# Patient Record
Sex: Female | Born: 1975 | Race: Black or African American | Hispanic: No | Marital: Single | Smoking: Never smoker
Health system: Southern US, Community
[De-identification: ages and names within clinical notes are randomized; demographics above are authoritative.]

## PROBLEM LIST (undated history)

## (undated) DIAGNOSIS — J302 Other seasonal allergic rhinitis: Secondary | ICD-10-CM

## (undated) DIAGNOSIS — R7303 Prediabetes: Secondary | ICD-10-CM

## (undated) DIAGNOSIS — K219 Gastro-esophageal reflux disease without esophagitis: Secondary | ICD-10-CM

## (undated) DIAGNOSIS — I10 Essential (primary) hypertension: Secondary | ICD-10-CM

## (undated) HISTORY — DX: Essential (primary) hypertension: I10

## (undated) HISTORY — PX: WISDOM TOOTH EXTRACTION: SHX21

## (undated) HISTORY — DX: Prediabetes: R73.03

## (undated) HISTORY — PX: BUNIONECTOMY: SHX129

## (undated) HISTORY — DX: Other seasonal allergic rhinitis: J30.2

## (undated) HISTORY — DX: Gastro-esophageal reflux disease without esophagitis: K21.9

---

## 2014-02-05 ENCOUNTER — Ambulatory Visit (INDEPENDENT_AMBULATORY_CARE_PROVIDER_SITE_OTHER): Payer: BC Managed Care – PPO | Admitting: Family Medicine

## 2014-02-05 ENCOUNTER — Encounter: Payer: Self-pay | Admitting: Family Medicine

## 2014-02-05 VITALS — BP 150/100 | HR 69 | Temp 98.5°F | Resp 16 | Ht 65.0 in | Wt 248.2 lb

## 2014-02-05 DIAGNOSIS — I10 Essential (primary) hypertension: Secondary | ICD-10-CM | POA: Insufficient documentation

## 2014-02-05 DIAGNOSIS — E669 Obesity, unspecified: Secondary | ICD-10-CM | POA: Insufficient documentation

## 2014-02-05 DIAGNOSIS — R5383 Other fatigue: Secondary | ICD-10-CM

## 2014-02-05 DIAGNOSIS — R5381 Other malaise: Secondary | ICD-10-CM

## 2014-02-05 DIAGNOSIS — Z1231 Encounter for screening mammogram for malignant neoplasm of breast: Secondary | ICD-10-CM

## 2014-02-05 LAB — POCT GLYCOSYLATED HEMOGLOBIN (HGB A1C): Hemoglobin A1C: 5.1

## 2014-02-05 MED ORDER — HYDROCHLOROTHIAZIDE 25 MG PO TABS: 25.0000 mg | ORAL_TABLET | Freq: Every day | ORAL | Status: DC

## 2014-02-05 MED ORDER — VERAPAMIL HCL ER 180 MG PO TBCR: 180.0000 mg | EXTENDED_RELEASE_TABLET | Freq: Every day | ORAL | Status: DC

## 2014-02-05 NOTE — Progress Notes (Signed)
Subjective:    Patient ID: Rachel Swanson, female    DOB: Mar 14, 1976, 38 y.o.   MRN: [id number]  HPI  This 38 y.o. AA female is new to [facility]; previously medical provided at [facility] due to lack of insurance. HTN diagnosed > 5 years ago and treated w/ Lisinopril- HCTZ; adverse reaction >> cough. Taking HCTZ 12.5- 25 mg daily; no labs ever checked and NP continue to prescribed HCTZ 50 mg though pt had been advised that higher dose not appropriate. Pt ran out of medication and experienced bloating and ankle edema. Brother-in-law had HCTZ 25 mg; pt resumed this med and felt better. Also took Furosemide supplied by a family member; this helped reduced edema.   She is experiencing fatigue though she is exercising and trying to lose weight. She requests medication for weight loss. She has lost ~ 11 lbs.  HCM: PAP ~ 2 years ago (last 3 PAPs normal). No GYN complaints. MMG- Never- 1st cousin diagnosed w/ breast cancer at age <51; pt has tried to get mammogram scheduled when she lived in [place] but incurance would not cover this test despite her family hx.   Review of Systems  Constitutional: Positive for activity change. Negative for diaphoresis, appetite change and unexpected weight change.  Eyes: Negative.        Eye exam within last 3 months.  Respiratory: Negative.   Cardiovascular: Positive for leg swelling. Negative for chest pain and palpitations.  Gastrointestinal: Negative.   Endocrine: Negative.   Musculoskeletal: Negative.   Skin: Negative.   Neurological: Positive for headaches.  Psychiatric/Behavioral: Negative.       Objective:   Physical Exam  Nursing note and vitals reviewed. Constitutional: She is oriented to person, place, and time. She appears well-developed and well-nourished. No distress.  HENT:  Head: Normocephalic and atraumatic.  Right Ear: Hearing, tympanic membrane, external ear and ear canal normal.  Left Ear: Hearing, tympanic membrane, external  ear and ear canal normal.  Nose: Nose normal. No nasal deformity or septal deviation.  Mouth/Throat: Uvula is midline, oropharynx is clear and moist and mucous membranes are normal. No oral lesions. Normal dentition. No dental caries.  Eyes: Conjunctivae, EOM and lids are normal. Pupils are equal, round, and reactive to light. No scleral icterus.  Fundoscopic exam:      The right eye shows no papilledema. The right eye shows red reflex.       The left eye shows no papilledema. The left eye shows red reflex.  Neck: Normal range of motion and full passive range of motion without pain. Neck supple. No JVD present. No spinous process tenderness and no muscular tenderness present. Thyromegaly present. No mass present.  Cardiovascular: Normal rate, regular rhythm, S2 normal and normal heart sounds.   No extrasystoles are present. Exam reveals no gallop and no friction rub.   No murmur heard. Pulmonary/Chest: Effort normal and breath sounds normal. No respiratory distress.  Abdominal: Soft. There is no tenderness. There is no CVA tenderness.  Musculoskeletal: Normal range of motion. She exhibits no edema and no tenderness.  Lymphadenopathy:       Head (right side): No submental, no submandibular, no tonsillar, no preauricular, no posterior auricular and no occipital adenopathy present.       Head (left side): No submental, no submandibular, no tonsillar, no preauricular, no posterior auricular and no occipital adenopathy present.    She has no cervical adenopathy.       Right: No supraclavicular adenopathy present.  Left: No supraclavicular adenopathy present.  Neurological: She is alert and oriented to person, place, and time. She has normal strength. She displays no atrophy. No cranial nerve deficit or sensory deficit. She exhibits normal muscle tone. Coordination and gait normal.  Reflex Scores:      Tricep reflexes are 1+ on the right side and 1+ on the left side.      Bicep reflexes are 1+  on the right side and 2+ on the left side.      Brachioradialis reflexes are 1+ on the right side and 2+ on the left side.      Patellar reflexes are 1+ on the right side and 2+ on the left side. Skin: Skin is warm, dry and intact. No ecchymosis and no rash noted. She is not diaphoretic. No cyanosis or erythema. Nails show no clubbing.  Psychiatric: She has a normal mood and affect. Her speech is normal and behavior is normal. Judgment and thought content normal. Cognition and memory are normal.   Results for orders placed in visit on 02/05/14  POCT GLYCOSYLATED HEMOGLOBIN (HGB A1C)      Result Value Ref Range   Hemoglobin A1C 5.1        Assessment & Plan:  Unspecified essential hypertension - Resume HCTZ 25 mg daily; add Verapamil 180 mg  1 tablet at bedtime. Continue TLCs and healthier lifestyle. Plan: Thyroid Panel With TSH, Vitamin D, 25-hydroxy, COMPLETE METABOLIC PANEL WITH GFR, CBC with Differential  Fatigue - Plan: POCT glycosylated hemoglobin (Hb A1C), Thyroid Panel With TSH, Vitamin D, 25-hydroxy  Obesity, unspecified - Plan: POCT glycosylated hemoglobin (Hb A1C), Thyroid Panel With TSH, Vitamin D, 25-hydroxy, COMPLETE METABOLIC PANEL WITH GFR, CBC with Differential  Other screening mammogram - Plan: MM Digital Screening  Meds ordered this encounter  Medications  . DISCONTD: hydrochlorothiazide (HYDRODIURIL) 50 MG tablet    Sig: Take 50 mg by mouth daily.  . hydrochlorothiazide (HYDRODIURIL) 25 MG tablet    Sig: Take 1 tablet (25 mg total) by mouth daily.    Dispense:  30 tablet    Refill:  3  . verapamil (CALAN-SR) 180 MG CR tablet    Sig: Take 1 tablet (180 mg total) by mouth at bedtime.    Dispense:  30 tablet    Refill:  3

## 2014-02-05 NOTE — Patient Instructions (Signed)
Hypertension As your heart beats, it forces blood through your arteries. This force is your blood pressure. If the pressure is too high, it is called hypertension (HTN) or high blood pressure. HTN is dangerous because you may have it and not know it. High blood pressure may mean that your heart has to work harder to pump blood. Your arteries may be narrow or stiff. The extra work puts you at risk for heart disease, stroke, and other problems.  Blood pressure consists of two numbers, a higher number over a lower, 110/72, for example. It is stated as "110 over 72." The ideal is below 120 for the top number (systolic) and under 80 for the bottom (diastolic). Write down your blood pressure today. You should pay close attention to your blood pressure if you have certain conditions such as:  Heart failure.  Prior heart attack.  Diabetes  Chronic kidney disease.  Prior stroke.  Multiple risk factors for heart disease. To see if you have HTN, your blood pressure should be measured while you are seated with your arm held at the level of the heart. It should be measured at least twice. A one-time elevated blood pressure reading (especially in the Emergency Department) does not mean that you need treatment. There may be conditions in which the blood pressure is different between your right and left arms. It is important to see your caregiver soon for a recheck. Most people have essential hypertension which means that there is not a specific cause. This type of high blood pressure may be lowered by changing lifestyle factors such as:  Stress.  Smoking.  Lack of exercise.  Excessive weight.  Drug/tobacco/alcohol use.  Eating less salt. Most people do not have symptoms from high blood pressure until it has caused damage to the body. Effective treatment can often prevent, delay or reduce that damage. TREATMENT  When a cause has been identified, treatment for high blood pressure is directed at the  cause. There are a large number of medications to treat HTN. These fall into several categories, and your caregiver will help you select the medicines that are best for you. Medications may have side effects. You should review side effects with your caregiver. If your blood pressure stays high after you have made lifestyle changes or started on medicines,   Your medication(s) may need to be changed.  Other problems may need to be addressed.  Be certain you understand your prescriptions, and know how and when to take your medicine.  Be sure to follow up with your caregiver within the time frame advised (usually within two weeks) to have your blood pressure rechecked and to review your medications.  If you are taking more than one medicine to lower your blood pressure, make sure you know how and at what times they should be taken. Taking two medicines at the same time can result in blood pressure that is too low. SEEK IMMEDIATE MEDICAL CARE IF:  You develop a severe headache, blurred or changing vision, or confusion.  You have unusual weakness or numbness, or a faint feeling.  You have severe chest or abdominal pain, vomiting, or breathing problems. MAKE SURE YOU:   Understand these instructions.  Will watch your condition.  Will get help right away if you are not doing well or get worse. Document Released: 09/11/2005 Document Revised: 12/04/2011 Document Reviewed: 05/01/2008 [facility] Patient Information 2014 [place].    Women and Heart Disease Heart disease (HD) risk factors for both men and  women are similar. Most studies addressing the diagnosis and treatment of heart disease have focused primarily on men. More research is now being done on women and heart disease.  GENDER DIFFERENCES  Symptoms of a heart attack for women may be more subtle, less typical and harder to identify.  Women are often slow to recognize heart disease risk factors and symptoms.  More women than  men die from a heart attack before reaching a hospital.  Results of medical tests can vary by gender, especially electrocardiogram stress testing.  A common perception is that men are more likely to have heart problems. This is not true, especially in women after menopause.  Effects of estrogen, birth control and hormone therapy can have unique effects on the heart.  Women are more likely to be referred for a mental health evaluation of their symptoms. CAUSES Heart disease may be caused from conditions such as:  Buildup of fat-like deposits (plaques) in the blood vessels (coronary arteries) of the heart. The build up of fat-like deposits cause blockages that decrease the blood flow to the heart muscle.  Blockage or narrowing of the coronary arteries decreases oxygen to the heart muscle.  Abnormal heart rhythms or problems with the electrical system of the heart.  Heart muscle that has become enlarged or weak and cannot pump well.  Abnormal heart valves that either leak or are thickened and do not open and close properly.  Damage from infection or drugs.  Heart problems present at birth. RISK FACTORS  Family history.  Elevated blood lipid levels (cholesterol).  High blood pressure.  Diabetes.  Smoking.  Inactivity, lack of exercise.  Weighing 30% more than your ideal weight.  Age.  Past history of heart problems. SYMPTOMS  Chest discomfort or pressure, which may include the following:  Discomfort, fullness, tightness, squeezing in center of chest that stays for a few minutes or comes and goes.  Discomfort or pressure that spreads to upper back, shoulders, neck, jaw or stomach.  Discomfort or tingling in the arms.  Profuse or clammy sweating.  Difficulty breathing.  Nausea.  Feeling your heart "flutter" or "jump."  Unexplained feelings of anxiety, fatigue or weakness.  Dizziness. DIAGNOSIS Diagnosis may include a test that:  Records the electrical  activity of the heart and looks for changes (EKG [electrocardiogram]) .  Detects the presence of special proteins and enzymes that may show damage to the heart muscle (blood tests).  Looks at the blood flow through the heart and coronary arteries by using special dyes and X-rays (coronary angiography).  Uses sound waves to examine your heart valves, muscle function and blood flow within the heart (echo or echocardiogram).  Looks for symptoms as your heart works harder under stress (stress tests).  Creates images of the heart by detecting radiation following administration of a radioactive tracer (nuclear imaging).  Records the electrical activity of the heart and helps in detecting abnormalities of heart rhythm (electrophysiology).  Creates an image of the anatomy of the heart (CT heart scan). TREATMENT   Medications may be used to control your blood pressure, keep your heart beating regularly, reduce pain, and help your breathing.  If you are admitted to the hospital, the length of your stay depends on the amount of heart damage and any complications you may have.  Severe heart problems may require open heart surgery. This is a procedure where blocked coronary arteries are bypassed with a vein from your leg.  If you have a single small coronary artery  blockage and no heart damage, you may have a balloon angioplasty. This procedure uses stents that may help open the blockage and restore normal heart circulation. Stents are small, wire, mesh-like tubes that help keep the artery open.  If needed, blood thinners may be used to dissolve clots. HOME CARE INSTRUCTIONS  Follow the treatment plan your caregiver prescribes.  Keep a list of every medicine you are taking. Keep it up to date and with you all the time.  Get help from your caregiver or pharmacist to learn the following about each medicine:  Why you are taking it.  What time of day to take it.  Possible side effects.  What  foods to take with your medication and which foods to avoid.  When to stop taking your medication.  Try to maintain normal cholesterol levels.  Eat a heart healthy diet with salt and fat restrictions as advised. PREVENTION  You can reduce your risk of heart disease by doing the following:  Visit your caregiver regularly and determine whether you are at risk.  Quit smoking and keep away from those who smoke.  Get your blood pressure checked regularly and make sure it remains within normal limits.  Limit salt in your diet.  Maintain normal blood sugar and cholesterol levels.  Exercise regularly (walking or another form of aerobic activity, preferably 30 minutes continuously).  Maintain ideal body weight.  Reduce stress, anger, and depression.  If you have already had a heart attack, consult your caregiver about methods and medicines that may help in reducing your risk of having a second heart attack.  Be aware of the symptoms of heart disease and seek medical care if you develop these symptoms. SEEK IMMEDIATE MEDICAL CARE IF:  You have severe chest pain or the above symptoms, call your local emergency services (911 if in [place] THIS IS AN EMERGENCY. Do not wait to see if the pain will go away. DO NOT drive yourself to the hospital.  You notice increasing shortness of breath during rest, sleeping or with activity. Insist that providers take your complaints seriously and do a thorough heart evaluation. Document Released: 02/28/2008 Document Revised: 12/04/2011 Document Reviewed: 02/28/2008 [facility] Patient Information 2014 [place], [place].

## 2014-02-06 LAB — CBC WITH DIFFERENTIAL/PLATELET
BASOS PCT: 1 % (ref 0–1)
Basophils Absolute: 0.1 10*3/uL (ref 0.0–0.1)
EOS ABS: 0.3 10*3/uL (ref 0.0–0.7)
Eosinophils Relative: 4 % (ref 0–5)
HCT: 40.4 % (ref 36.0–46.0)
Hemoglobin: 14.2 g/dL (ref 12.0–15.0)
Lymphocytes Relative: 31 % (ref 12–46)
Lymphs Abs: 2.1 10*3/uL (ref 0.7–4.0)
MCH: 30.1 pg (ref 26.0–34.0)
MCHC: 35.1 g/dL (ref 30.0–36.0)
MCV: 85.6 fL (ref 78.0–100.0)
Monocytes Absolute: 0.6 10*3/uL (ref 0.1–1.0)
Monocytes Relative: 8 % (ref 3–12)
NEUTROS PCT: 56 % (ref 43–77)
Neutro Abs: 3.9 10*3/uL (ref 1.7–7.7)
PLATELETS: 303 10*3/uL (ref 150–400)
RBC: 4.72 MIL/uL (ref 3.87–5.11)
RDW: 13.7 % (ref 11.5–15.5)
WBC: 6.9 10*3/uL (ref 4.0–10.5)

## 2014-02-06 LAB — THYROID PANEL WITH TSH
Free Thyroxine Index: 2.9 (ref 1.0–3.9)
T3 Uptake: 37.1 % — ABNORMAL HIGH (ref 22.5–37.0)
T4 TOTAL: 7.9 ug/dL (ref 5.0–12.5)
TSH: 1.343 u[IU]/mL (ref 0.350–4.500)

## 2014-02-06 LAB — COMPLETE METABOLIC PANEL WITH GFR
ALT: 17 U/L (ref 0–35)
AST: 21 U/L (ref 0–37)
Albumin: 4.2 g/dL (ref 3.5–5.2)
Alkaline Phosphatase: 53 U/L (ref 39–117)
BUN: 13 mg/dL (ref 6–23)
CALCIUM: 9.8 mg/dL (ref 8.4–10.5)
CO2: 33 meq/L — AB (ref 19–32)
CREATININE: 1 mg/dL (ref 0.50–1.10)
Chloride: 97 mEq/L (ref 96–112)
GFR, EST AFRICAN AMERICAN: 81 mL/min
GFR, Est Non African American: 71 mL/min
Glucose, Bld: 62 mg/dL — ABNORMAL LOW (ref 70–99)
Potassium: 3.5 mEq/L (ref 3.5–5.3)
Sodium: 137 mEq/L (ref 135–145)
Total Bilirubin: 0.5 mg/dL (ref 0.2–1.2)
Total Protein: 7.7 g/dL (ref 6.0–8.3)

## 2014-02-07 LAB — VITAMIN D 25 HYDROXY (VIT D DEFICIENCY, FRACTURES): Vit D, 25-Hydroxy: 25 ng/mL — ABNORMAL LOW (ref 30–89)

## 2014-02-07 NOTE — Progress Notes (Signed)
Quick Note:  Please advise pt regarding following labs... Vitamin D level is too low. Get an over-the-counter Vitamin D3 supplement- 2000 units per capsule - and take 1 capsule daily. Try to get 10-15 minutes of sun exposure most days of the week. Eat more Vitamin D-rich foods- salmon, tuna, sardines, mackerel, mushrooms, eggs and some dairy products. Thyroid function is normal. Salts (sodium, potassium, calcium) in the blood are normal. Blood sugar is good and kidney and liver function tests are normal. Blood counts are normal.  Copy to pt. ______

## 2014-02-13 ENCOUNTER — Ambulatory Visit (HOSPITAL_COMMUNITY)
Admission: RE | Admit: 2014-02-13 | Discharge: 2014-02-13 | Disposition: A | Discharge status: Home / Self Care | Payer: BC Managed Care – PPO | Source: Ambulatory Visit | Attending: Family Medicine | Admitting: Family Medicine

## 2014-02-13 ENCOUNTER — Other Ambulatory Visit: Payer: Self-pay | Admitting: Family Medicine

## 2014-02-13 DIAGNOSIS — Z1231 Encounter for screening mammogram for malignant neoplasm of breast: Secondary | ICD-10-CM | POA: Insufficient documentation

## 2014-02-17 ENCOUNTER — Ambulatory Visit (HOSPITAL_COMMUNITY): Payer: Self-pay

## 2014-03-19 ENCOUNTER — Encounter: Payer: Self-pay | Admitting: Family Medicine

## 2014-03-19 ENCOUNTER — Ambulatory Visit (INDEPENDENT_AMBULATORY_CARE_PROVIDER_SITE_OTHER): Payer: BC Managed Care – PPO | Admitting: Family Medicine

## 2014-03-19 VITALS — BP 140/87 | HR 72 | Temp 98.4°F | Resp 16 | Ht 64.5 in | Wt 250.4 lb

## 2014-03-19 DIAGNOSIS — J309 Allergic rhinitis, unspecified: Secondary | ICD-10-CM

## 2014-03-19 DIAGNOSIS — I10 Essential (primary) hypertension: Secondary | ICD-10-CM

## 2014-03-19 DIAGNOSIS — J302 Other seasonal allergic rhinitis: Secondary | ICD-10-CM | POA: Insufficient documentation

## 2014-03-19 DIAGNOSIS — E559 Vitamin D deficiency, unspecified: Secondary | ICD-10-CM

## 2014-03-19 NOTE — Patient Instructions (Signed)
Allergic Rhinitis Allergic rhinitis is when the mucous membranes in the nose respond to allergens. Allergens are particles in the air that cause your body to have an allergic reaction. This causes you to release allergic antibodies. Through a chain of events, these eventually cause you to release histamine into the blood stream. Although meant to protect the body, it is this release of histamine that causes your discomfort, such as frequent sneezing, congestion, and an itchy, runny nose.  CAUSES  Seasonal allergic rhinitis (hay fever) is caused by pollen allergens that may come from grasses, trees, and weeds. Year-round allergic rhinitis (perennial allergic rhinitis) is caused by allergens such as house dust mites, pet dander, and mold spores.  SYMPTOMS   Nasal stuffiness (congestion).  Itchy, runny nose with sneezing and tearing of the eyes. DIAGNOSIS  Your health care provider can help you determine the allergen or allergens that trigger your symptoms. If you and your health care provider are unable to determine the allergen, skin or blood testing may be used. TREATMENT  Allergic rhinitis does not have a cure, but it can be controlled by:  Medicines and allergy shots (immunotherapy).  Avoiding the allergen. Hay fever may often be treated with antihistamines in pill or nasal spray forms. Antihistamines block the effects of histamine. There are over-the-counter medicines that may help with nasal congestion and swelling around the eyes. Check with your health care provider before taking or giving this medicine.  If avoiding the allergen or the medicine prescribed do not work, there are many new medicines your health care provider can prescribe. Stronger medicine may be used if initial measures are ineffective. Desensitizing injections can be used if medicine and avoidance does not work. Desensitization is when a patient is given ongoing shots until the body becomes less sensitive to the allergen.  Make sure you follow up with your health care provider if problems continue. HOME CARE INSTRUCTIONS It is not possible to completely avoid allergens, but you can reduce your symptoms by taking steps to limit your exposure to them. It helps to know exactly what you are allergic to so that you can avoid your specific triggers. SEEK MEDICAL CARE IF:   You have a fever.  You develop a cough that does not stop easily (persistent).  You have shortness of breath.  You start wheezing.  Symptoms interfere with normal daily activities. Document Released: 06/06/2001 Document Revised: 09/16/2013 Document Reviewed: 05/19/2013 [facility] Patient Information 2015 [place], [place]. This information is not intended to replace advice given to you by your health care provider. Make sure you discuss any questions you have with your health care provider.    GET over-the-counter AYR SALINE NASAL MIST and take your allergy medication every day.

## 2014-03-21 NOTE — Progress Notes (Signed)
S:  This 38 y.o. AA female is here for HTN follow-up. Unfortunately, her father passed away with last 2 weeks; he resided in [place] and pt is only child. She has family there who are helping her w/ taking care of his affairs. She is coping well. States BP medication is effective w/o adverse effects. She has no diaphoresis, fatigue,vision disturbances, CP or tightness, palpitations, edema, SOB or DOE, nausea, contispation, rash, HA, dizziness, numbness, weakness or syncope.  Pt has hx of Vit D deficiency; has taken supplement intermittently. Has not increased dietary intake but sun exposure has been adequate.  Seasonal allergies- pt taking OTC anti-histamine as needed. She c/o congestion, PND and mild sore throat. No fever, sinus pressure/ HA or cough.  Patient Active Problem List   Diagnosis Date Noted  . Unspecified vitamin D deficiency 03/19/2014  . Seasonal allergies 03/19/2014  . Unspecified essential hypertension 02/05/2014  . Obesity, unspecified 02/05/2014    Prior to Admission medications   Medication Sig Start Date End Date Taking? Authorizing Provider  cetirizine (ZYRTEC) 10 MG tablet Take 10 mg by mouth daily.   Yes Historical Provider, MD  hydrochlorothiazide (HYDRODIURIL) 25 MG tablet Take 1 tablet (25 mg total) by mouth daily. 02/05/14  Yes Barton Fanny, MD  PRESCRIPTION MEDICATION    Yes Historical Provider, MD  verapamil (CALAN-SR) 180 MG CR tablet Take 1 tablet (180 mg total) by mouth at bedtime. 02/05/14  Yes Barton Fanny, MD   PMHx, Surg Hx, Soc and Fam Hx reviewed.  ROS: As per HPI. She has mild sleep disturbance since father's death but denies agitation, dysphoric mood, confusion, severe concentration difficulties or behavior changes. No thoughts of self harm, etc.  O: Filed Vitals:   03/19/14 1048  BP: 140/87  Pulse: 72  Temp: 98.4 F (36.9 C)  Resp: 16   GEN: In NAD: WN,WD. HEENT: [place]/AT; EOMI w/ clear conj/sclerae. Ext ears/canals/TMs  normal. Nasal mucosa edematous; post ph erythematous, + cobblestoning w/o exudate. NECK: Supple w/o LAN or TMG. COR: RRR. Normal S1 and S2. No m/g/r. LUNGS: CTA; no wheezes or rales. SKIN: W&D; intact w/o diaphoresis, erythema or rashes. MS: MAEs; no deformities, c/c/e. NEURO: A&O x3; CNs intact. Nonfocal. PSYCH: Pleasant, calm and attentive. Good eye contact. Speech and behavior normal. Thought content appropriate. Judgement sound.  A/P: Unspecified essential hypertension- Stable and controlled on current medications. Continue same.  Unspecified vitamin D deficiency- Reviewed need for OTC supplement daily and dietary changes + sun exposure.  Seasonal allergies- Symptomatic treatment.

## 2014-06-12 ENCOUNTER — Ambulatory Visit (INDEPENDENT_AMBULATORY_CARE_PROVIDER_SITE_OTHER): Payer: BC Managed Care – PPO | Admitting: Family Medicine

## 2014-06-12 ENCOUNTER — Encounter: Payer: Self-pay | Admitting: Family Medicine

## 2014-06-12 VITALS — BP 134/84 | HR 64 | Temp 97.7°F | Resp 16 | Ht 64.5 in | Wt 258.0 lb

## 2014-06-12 DIAGNOSIS — S8991XS Unspecified injury of right lower leg, sequela: Secondary | ICD-10-CM

## 2014-06-12 DIAGNOSIS — J309 Allergic rhinitis, unspecified: Secondary | ICD-10-CM

## 2014-06-12 DIAGNOSIS — Z23 Encounter for immunization: Secondary | ICD-10-CM

## 2014-06-12 DIAGNOSIS — IMO0001 Reserved for inherently not codable concepts without codable children: Secondary | ICD-10-CM

## 2014-06-12 DIAGNOSIS — J302 Other seasonal allergic rhinitis: Secondary | ICD-10-CM

## 2014-06-12 DIAGNOSIS — I10 Essential (primary) hypertension: Secondary | ICD-10-CM

## 2014-06-12 MED ORDER — FLUTICASONE PROPIONATE 50 MCG/ACT NA SUSP: 2.0000 | Freq: Every day | NASAL | Status: DC

## 2014-06-12 MED ORDER — VERAPAMIL HCL ER 180 MG PO TBCR: 180.0000 mg | EXTENDED_RELEASE_TABLET | Freq: Every day | ORAL | Status: DC

## 2014-06-12 MED ORDER — MONTELUKAST SODIUM 10 MG PO TABS: 10.0000 mg | ORAL_TABLET | Freq: Every day | ORAL | Status: DC

## 2014-06-12 MED ORDER — HYDROCHLOROTHIAZIDE 25 MG PO TABS: 25.0000 mg | ORAL_TABLET | Freq: Every day | ORAL | Status: DC

## 2014-06-12 NOTE — Patient Instructions (Signed)
Allergic Rhinitis Allergic rhinitis is when the mucous membranes in the nose respond to allergens. Allergens are particles in the air that cause your body to have an allergic reaction. This causes you to release allergic antibodies. Through a chain of events, these eventually cause you to release histamine into the blood stream. Although meant to protect the body, it is this release of histamine that causes your discomfort, such as frequent sneezing, congestion, and an itchy, runny nose.  CAUSES  Seasonal allergic rhinitis (hay fever) is caused by pollen allergens that may come from grasses, trees, and weeds. Year-round allergic rhinitis (perennial allergic rhinitis) is caused by allergens such as house dust mites, pet dander, and mold spores.  SYMPTOMS   Nasal stuffiness (congestion).  Itchy, runny nose with sneezing and tearing of the eyes. DIAGNOSIS  Your health care provider can help you determine the allergen or allergens that trigger your symptoms. If you and your health care provider are unable to determine the allergen, skin or blood testing may be used. TREATMENT  Allergic rhinitis does not have a cure, but it can be controlled by:  Medicines and allergy shots (immunotherapy).  Avoiding the allergen. Hay fever may often be treated with antihistamines in pill or nasal spray forms. Antihistamines block the effects of histamine. There are over-the-counter medicines that may help with nasal congestion and swelling around the eyes. Check with your health care provider before taking or giving this medicine.  If avoiding the allergen or the medicine prescribed do not work, there are many new medicines your health care provider can prescribe. Stronger medicine may be used if initial measures are ineffective. Desensitizing injections can be used if medicine and avoidance does not work. Desensitization is when a patient is given ongoing shots until the body becomes less sensitive to the allergen.  Make sure you follow up with your health care provider if problems continue. HOME CARE INSTRUCTIONS It is not possible to completely avoid allergens, but you can reduce your symptoms by taking steps to limit your exposure to them. It helps to know exactly what you are allergic to so that you can avoid your specific triggers. SEEK MEDICAL CARE IF:   You have a fever.  You develop a cough that does not stop easily (persistent).  You have shortness of breath.  You start wheezing.  Symptoms interfere with normal daily activities. Document Released: 06/06/2001 Document Revised: 09/16/2013 Document Reviewed: 05/19/2013 [facility] Patient Information 2015 [place], [place]. This information is not intended to replace advice given to you by your health care provider. Make sure you discuss any questions you have with your health care provider.    Get an air purifier for use in your bedroom and in the living area in your home.

## 2014-06-16 NOTE — Progress Notes (Signed)
Subjective:    Patient ID: Rachel Swanson, female    DOB: [birth year], 38 y.o.   MRN: [id number]  HPI  This 38 y.o. AA female has chronic seasonal allergies; symptoms are worse this year despite OTC antihistamine. She has used nasal spray in the past and now uses medication "as needed". Pt c/o rhinorrhea. Congestion, sinus pressure and sore throat associated w/ PND.  Pt is concerned about painful swelling on R lower leg. She reports that she was in [place], tending to  [employer] after her father's death. She suffered an injury/laceration of anteromedial calf area and received medical attention at a local facility. She took a brief course of antibiotics but does not think she received a Tetanus vaccine. Now, she c/o mass and some paresthesias in the area of the injury (which is well healed).  HTN- pt is compliant w/ medications w/o adverse effects. She denies diaphoresis, CP or tightness, palpitations, HA, dizziness, weakness or syncope.  Patient Active Problem List   Diagnosis Date Noted  . Unspecified vitamin D deficiency 03/19/2014  . Seasonal allergies 03/19/2014  . Unspecified essential hypertension 02/05/2014  . Obesity, unspecified 02/05/2014    Prior to Admission medications   Medication Sig Start Date End Date Taking? Authorizing Provider  hydrochlorothiazide (HYDRODIURIL) 25 MG tablet Take 1 tablet (25 mg total) by mouth daily.   Yes Barton Fanny, MD  PRESCRIPTION MEDICATION    Yes Historical Provider, MD  verapamil (CALAN-SR) 180 MG CR tablet Take 1 tablet (180 mg total) by mouth at bedtime.   Yes Barton Fanny, MD  fluticasone [facility]) 50 MCG/ACT nasal spray Place 2 sprays into both nostrils daily.    Barton Fanny, MD    History   Social History  . Marital Status: Single    Spouse Name: N/A    Number of Children: N/A  . Years of Education: N/A   Occupational History  . Not on file.   Social History Main Topics  . Smoking status: Never  Smoker   . Smokeless tobacco: Not on file  . Alcohol Use: No  . Drug Use: Not on file  . Sexual Activity: Not on file   Other Topics Concern  . Not on file   Social History Narrative  . No narrative on file    Family History  Problem Relation Age of Onset  . Heart disease Mother   . Hyperlipidemia Father   . Diabetes Sister      Review of Systems  Constitutional: Positive for fatigue. Negative for fever, chills and appetite change.  HENT: Positive for congestion, postnasal drip, rhinorrhea, sinus pressure, sneezing and sore throat.   Eyes: Positive for redness.  Respiratory: Positive for cough. Negative for choking, chest tightness, shortness of breath and wheezing.   Cardiovascular: Negative.   Allergic/Immunologic: Positive for environmental allergies. Negative for immunocompromised state.  Neurological: Negative.   Psychiatric/Behavioral: Negative.        Objective:   Physical Exam  Nursing note and vitals reviewed. Constitutional: She is oriented to person, place, and time. Vital signs are normal. She appears well-developed and well-nourished. No distress.  HENT:  Head: Normocephalic and atraumatic.  Right Ear: Hearing, tympanic membrane, external ear and ear canal normal.  Left Ear: Hearing, tympanic membrane, external ear and ear canal normal.  Nose: Mucosal edema present. No rhinorrhea, nasal deformity or septal deviation. Right sinus exhibits no maxillary sinus tenderness and no frontal sinus tenderness. Left sinus exhibits no maxillary sinus tenderness and no  frontal sinus tenderness.  Mouth/Throat: Uvula is midline and mucous membranes are normal. No oral lesions. Normal dentition. No uvula swelling. Posterior oropharyngeal erythema present. No oropharyngeal exudate or posterior oropharyngeal edema.  Eyes: EOM and lids are normal. Pupils are equal, round, and reactive to light. Right conjunctiva is injected. Left conjunctiva is injected. No scleral icterus.    Neck: Trachea normal, normal range of motion, full passive range of motion without pain and phonation normal. Neck supple. Thyromegaly present. No mass present.  Cardiovascular: Normal rate, regular rhythm, S1 normal, S2 normal and normal heart sounds.   Pulmonary/Chest: Effort normal and breath sounds normal. No respiratory distress.  Musculoskeletal:  R lower leg- anterior aspect of calf w/ well healed scar; firm 3 x 3.5 cm well circumscribed area below scar. No edema. Distal pulses normal.  Lymphadenopathy:       Head (right side): No submental, no submandibular, no tonsillar, no preauricular and no occipital adenopathy present.       Head (left side): No submandibular, no tonsillar, no preauricular, no posterior auricular and no occipital adenopathy present.    She has no cervical adenopathy.  Neurological: She is alert and oriented to person, place, and time. No cranial nerve deficit. Coordination normal.  Skin: Skin is warm and dry. No rash noted. She is not diaphoretic. No erythema.  Psychiatric: She has a normal mood and affect. Her behavior is normal. Judgment and thought content normal.       Assessment & Plan:  Seasonal allergies- Trial Singulair 10 mg  1 tablet every evening. Continue Fluticasone daily. Advised other measures to take to reduce exposure to allergens.  Injury of lower leg, right, sequela- Reassurnace; this is probably a old hematoma that has not completely resolved.  Unspecified essential hypertension- Stable on current medications.  Need for prophylactic vaccination with combined diphtheria-tetanus-pertussis (DTP) vaccine   Meds ordered this encounter  Medications  . hydrochlorothiazide (HYDRODIURIL) 25 MG tablet    Sig: Take 1 tablet (25 mg total) by mouth daily.    Dispense:  90 tablet    Refill:  3  . montelukast (SINGULAIR) 10 MG tablet    Sig: Take 1 tablet (10 mg total) by mouth at bedtime.    Dispense:  30 tablet    Refill:  11  . verapamil  (CALAN-SR) 180 MG CR tablet    Sig: Take 1 tablet (180 mg total) by mouth at bedtime.    Dispense:  90 tablet    Refill:  3  . fluticasone (FLONASE) 50 MCG/ACT nasal spray    Sig: Place 2 sprays into both nostrils daily.    Dispense:  16 g    Refill:  11

## 2014-12-05 ENCOUNTER — Ambulatory Visit (INDEPENDENT_AMBULATORY_CARE_PROVIDER_SITE_OTHER): Payer: BLUE CROSS/BLUE SHIELD | Admitting: Family Medicine

## 2014-12-05 ENCOUNTER — Ambulatory Visit (INDEPENDENT_AMBULATORY_CARE_PROVIDER_SITE_OTHER): Payer: BLUE CROSS/BLUE SHIELD

## 2014-12-05 VITALS — BP 122/84 | HR 57 | Temp 97.4°F | Resp 19 | Ht 64.5 in | Wt 264.4 lb

## 2014-12-05 DIAGNOSIS — T148XXA Other injury of unspecified body region, initial encounter: Secondary | ICD-10-CM

## 2014-12-05 DIAGNOSIS — M79604 Pain in right leg: Secondary | ICD-10-CM | POA: Diagnosis not present

## 2014-12-05 DIAGNOSIS — J302 Other seasonal allergic rhinitis: Secondary | ICD-10-CM

## 2014-12-05 DIAGNOSIS — T148 Other injury of unspecified body region: Secondary | ICD-10-CM

## 2014-12-05 DIAGNOSIS — M25561 Pain in right knee: Secondary | ICD-10-CM

## 2014-12-05 DIAGNOSIS — R635 Abnormal weight gain: Secondary | ICD-10-CM

## 2014-12-05 DIAGNOSIS — K219 Gastro-esophageal reflux disease without esophagitis: Secondary | ICD-10-CM | POA: Diagnosis not present

## 2014-12-05 DIAGNOSIS — I10 Essential (primary) hypertension: Secondary | ICD-10-CM

## 2014-12-05 LAB — POCT CBC
Granulocyte percent: 53 %G (ref 37–80)
HCT, POC: 41.7 % (ref 37.7–47.9)
Hemoglobin: 13 g/dL (ref 12.2–16.2)
Lymph, poc: 2.2 (ref 0.6–3.4)
MCH, POC: 28.5 pg (ref 27–31.2)
MCHC: 31.2 g/dL — AB (ref 31.8–35.4)
MCV: 91.5 fL (ref 80–97)
MID (cbc): 0.3 (ref 0–0.9)
MPV: 6.9 fL (ref 0–99.8)
POC Granulocyte: 2.8 (ref 2–6.9)
POC LYMPH PERCENT: 41.6 % (ref 10–50)
POC MID %: 5.4 %M (ref 0–12)
Platelet Count, POC: 289 10*3/uL (ref 142–424)
RBC: 4.56 M/uL (ref 4.04–5.48)
RDW, POC: 13.9 %
WBC: 5.3 10*3/uL (ref 4.6–10.2)

## 2014-12-05 LAB — COMPLETE METABOLIC PANEL WITHOUT GFR
Alkaline Phosphatase: 54 U/L (ref 39–117)
BUN: 12 mg/dL (ref 6–23)
Glucose, Bld: 88 mg/dL (ref 70–99)
Potassium: 3.3 meq/L — ABNORMAL LOW (ref 3.5–5.3)
Sodium: 139 meq/L (ref 135–145)

## 2014-12-05 LAB — COMPLETE METABOLIC PANEL WITH GFR
ALT: 12 U/L (ref 0–35)
AST: 15 U/L (ref 0–37)
Albumin: 3.6 g/dL (ref 3.5–5.2)
CO2: 30 mEq/L (ref 19–32)
Calcium: 9 mg/dL (ref 8.4–10.5)
Chloride: 99 mEq/L (ref 96–112)
Creat: 0.85 mg/dL (ref 0.50–1.10)
GFR, Est African American: >89 mL/min
GFR, Est Non African American: 85 mL/min
Total Bilirubin: 0.6 mg/dL (ref 0.2–1.2)
Total Protein: 7.3 g/dL (ref 6.0–8.3)

## 2014-12-05 LAB — LIPID PANEL
Cholesterol: 159 mg/dL (ref 0–200)
HDL: 54 mg/dL (ref >=46)
LDL Cholesterol: 91 mg/dL (ref 0–99)
Total CHOL/HDL Ratio: 2.9 Ratio
Triglycerides: 72 mg/dL (ref <150)
VLDL: 14 mg/dL (ref 0–40)

## 2014-12-05 LAB — POCT GLYCOSYLATED HEMOGLOBIN (HGB A1C): Hemoglobin A1C: 5.3

## 2014-12-05 LAB — TSH: TSH: 1.236 u[IU]/mL (ref 0.350–4.500)

## 2014-12-05 MED ORDER — DICLOFENAC SODIUM 1 % TD GEL: 4.0000 g | Freq: Four times a day (QID) | TRANSDERMAL | Status: DC

## 2014-12-05 NOTE — Patient Instructions (Signed)
Lorcaserin oral tablets What is this medicine? LORCASERIN (lor ca SER in) is used to promote and maintain weight loss in obese patients. This medicine should be used with a reduced calorie diet and, if appropriate, an exercise program. This medicine may be used for other purposes; ask your health care provider or pharmacist if you have questions. COMMON BRAND NAME(S): Belviq What should I tell my health care provider before I take this medicine? They need to know if you have any of these conditions: -anatomical deformation of the penis, Peyronie's disease, or history of priapism (painful and prolonged erection) -diabetes -heart disease -history of blood diseases, like sickle cell anemia or leukemia -history of irregular heartbeat -kidney disease -liver disease -suicidal thoughts, plans, or attempt; a previous suicide attempt by you or a family member -an unusual or allergic reaction to lorcaserin, other medicines, foods, dyes, or preservatives -pregnant or trying to get pregnant -breast-feeding How should I use this medicine? Take this medicine by mouth with a glass of water. Follow the directions on the prescription label. You can take it with or without food. Take your medicine at regular intervals. Do not take it more often than directed. Do not stop taking except on your doctor's advice. Talk to your pediatrician regarding the use of this medicine in children. Special care may be needed. Overdosage: If you think you've taken too much of this medicine contact a poison control center or emergency room at once. Overdosage: If you think you have taken too much of this medicine contact a poison control center or emergency room at once. NOTE: This medicine is only for you. Do not share this medicine with others. What if I miss a dose? If you miss a dose, take it as soon as you can. If it is almost time for your next dose, take only that dose. Do not take double or extra doses. What may  interact with this medicine? -cabergoline -certain medicines for depression, anxiety, or psychotic disturbances -certain medicines for erectile dysfunction -certain medicines for migraine headache like almotriptan, eletriptan, frovatriptan, naratriptan, rizatriptan, sumatriptan, zolmitriptan -dextromethorphan -linezolid -MAOIs like Carbex, Eldepryl, Marplan, Nardil, and Parnate -medicines for diabetes -orlistat -tramadol -St. John's Wort -stimulant medicines for attention disorders, weight loss, or to stay awake This list may not describe all possible interactions. Give your health care provider a list of all the medicines, herbs, non-prescription drugs, or dietary supplements you use. Also tell them if you smoke, drink alcohol, or use illegal drugs. Some items may interact with your medicine. What should I watch for while using this medicine? This medicine is intended to be used in addition to a healthy diet and appropriate exercise. The best results are achieved this way. Do not increase or in any way change your dose without consulting your doctor or health care professional. Your doctor should tell you to stop taking this medicine if you do not lose a certain amount of weight within the first 12 weeks of treatment. Visit your doctor or health care professional for regular checkups. Your doctor may order blood tests or other tests to see how you are doing. Do not drive, use machinery, or do anything that needs mental alertness until you know how this medicine affects you. This medicine may affect blood sugar levels. If you have diabetes, check with your doctor or health care professional before you change your diet or the dose of your diabetic medicine. Patients and their families should watch out for worsening depression or thoughts of suicide. Also  watch out for sudden changes in feelings such as feeling anxious, agitated, panicky, irritable, hostile, aggressive, impulsive, severely restless,  overly excited and hyperactive, or not being able to sleep. If this happens, especially at the beginning of treatment or after a change in dose, call your health care professional. Contact you doctor or health care professional right away if the erection lasts longer than 4 hours or if it becomes painful. This may be a sign of serious problem and must be treated right away to prevent permanent damage. What side effects may I notice from receiving this medicine? Side effects that you should report to your doctor or health care professional as soon as possible: -allergic reactions like skin rash, itching or hives, swelling of the face, lips, or tongue -abnormal production of milk -breast enlargement in both males and females -breathing problems -changes in emotions or moods -changes in vision -confusion -erection lasting more than 4 hours -fast or irregular heart beat -feeling faint or lightheaded, falls -fever or chills, sore throat -hallucination, loss of contact with reality -high or low blood pressure -menstrual changes -restlessness -seizures -slow or irregular heartbeat -stiff muscles -sweating -suicidal thoughts or other mood changes -tremors -trouble walking -unusually weak or tired -vomiting Side effects that usually do not require medical attention (Report these to your doctor or health care professional if they continue or are bothersome.): -back pain -constipation -cough -dizziness -dry mouth -low blood sugar if you have diabetes (ask your doctor or healthcare professional for a list of these symptoms) -nausea -tiredness This list may not describe all possible side effects. Call your doctor for medical advice about side effects. You may report side effects to FDA at 1-800-FDA-1088. Where should I keep my medicine? Keep out of the reach of children. Store at room temperature between 15 and 30 degrees C (59 and 86 degrees F). Throw away any unused medicine after the  expiration date. NOTE: This sheet is a summary. It may not cover all possible information. If you have questions about this medicine, talk to your doctor, pharmacist, or health care provider.  2015, [employer]. (2011-03-28 17:15:17)

## 2014-12-05 NOTE — Progress Notes (Signed)
 Chief Complaint:  Chief Complaint  Patient presents with  . Leg Pain    fell on leg last summer, received tetanus shot, did not get x-ray then, would like one today    HPI: Rachel Swanson is a 39 y.o. female who is here for  1. She is here for a one-year history of right leg pain after [place] in pain during her father. In her calf at that time and has gone down but she still has some pain with walking. 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 It is in her bones and she never got an x-ray for she wants to make sure that nothing is wrong with her bones. She has tried her result. General seems to help. 10/10 constant sharp pain today. She denies any numbness weakness tingling. She denies chest pain or shortness of breath. There is no asymmetry, warmth, or evidence of DVT.  2. She has high blood pressure. She takes her medication consistently without any side effects.  3. She has been trying to lose weight continues to be consistently gaining weight. She has cut out all sugars in her diet as best as she can. She does have an occasional juice with her medication and ice tea. She eats well. Still could have a diet supplement. Wt Readings from Last 3 Encounters:  12/05/14 264 lb 6.4 oz (119.931 kg)  06/12/14 258 lb (117.028 kg)  03/19/14 250 lb 6.4 oz (113.581 kg)     Past Medical History  Diagnosis Date  . Hypertension    History reviewed. No pertinent past surgical history. History   Social History  . Marital Status: Single    Spouse Name: N/A  . Number of Children: N/A  . Years of Education: N/A   Social History Main Topics  . Smoking status: Never Smoker   . Smokeless tobacco: Not on file  . Alcohol Use: No  . Drug Use: Not on file  . Sexual Activity: Not on file   Other Topics Concern  . None   Social History Narrative   Family History  Problem Relation Age of Onset  . Heart disease  Mother   . Hyperlipidemia Father   . Diabetes Sister    Allergies  Allergen Reactions  . Ace Inhibitors Cough   Prior to Admission medications   Medication Sig Start Date End Date Taking? Authorizing Provider  fluticasone (FLONASE) 50 MCG/ACT nasal spray Place 2 sprays into both nostrils daily. 06/12/14  Yes Barton Fanny, MD  hydrochlorothiazide (HYDRODIURIL) 25 MG tablet Take 1 tablet (25 mg total) by mouth daily. 06/12/14  Yes Barton Fanny, MD  montelukast (SINGULAIR) 10 MG tablet Take 1 tablet (10 mg total) by mouth at bedtime. 06/12/14  Yes Barton Fanny, MD  PRESCRIPTION MEDICATION    Yes Historical Provider, MD  verapamil (CALAN-SR) 180 MG CR tablet Take 1 tablet (180 mg total) by mouth at bedtime. 06/12/14  Yes Barton Fanny, MD     ROS: The patient denies fevers, chills, night sweats, unintentional weight loss, chest pain, palpitations, wheezing, dyspnea on exertion, nausea, vomiting, abdominal pain, dysuria, hematuria, melena, numbness, weakness, or tingling.   All other systems have been reviewed and were otherwise negative with the exception of those mentioned in the HPI and as above.    PHYSICAL EXAM: Filed Vitals:   12/05/14 1024  BP: 122/84  Pulse: 57  Temp: 97.4 F (36.3 C)  Resp: 19   Filed Vitals:   12/05/14 1024  Height: 5' 4.5" (1.638 m)  Weight: 264 lb 6.4 oz (119.931 kg)   Body mass index is 44.7 kg/(m^2).  General: Alert, no acute distress, morbidly obese female HEENT:  Normocephalic, atraumatic, oropharynx patent. EOMI, PERRLA Cardiovascular:  Regular rate and rhythm, no rubs murmurs or gallops.  No Carotid bruits, radial pulse intact. No pedal edema.  Respiratory: Clear to auscultation bilaterally.  No wheezes, rales, or rhonchi.  No cyanosis, no use of accessory musculature GI: No organomegaly, abdomen is soft and non-tender, positive bowel sounds.  No masses. Skin: No rashes. Neurologic: Facial musculature  symmetric. Psychiatric: Patient is appropriate throughout our interaction. Lymphatic: No cervical lymphadenopathy Musculoskeletal: Gait intact. No deformity, Neg ballotment Diffuse tenderness, crepitus  5/5 strength, 2/2 DTRs ankle ,  Neg Lachman, Neg medial jt line tenderness, neg McMurray L spine normal ROM,  Straight leg negative Right calf, nontender, neg Homans, + DP, neg varicosities    LABS: Results for orders placed or performed in visit on 12/05/14  POCT glycosylated hemoglobin (Hb A1C)  Result Value Ref Range   Hemoglobin A1C 5.3   POCT CBC  Result Value Ref Range   WBC 5.3 4.6 - 10.2 K/uL   Lymph, poc 2.2 0.6 - 3.4   POC LYMPH PERCENT 41.6 10 - 50 %L   MID (cbc) 0.3 0 - 0.9   POC MID % 5.4 0 - 12 %M   POC Granulocyte 2.8 2 - 6.9   Granulocyte percent 53.0 37 - 80 %G   RBC 4.56 4.04 - 5.48 M/uL   Hemoglobin 13.0 12.2 - 16.2 g/dL   HCT, POC 41.7 37.7 - 47.9 %   MCV 91.5 80 - 97 fL   MCH, POC 28.5 27 - 31.2 pg   MCHC 31.2 (A) 31.8 - 35.4 g/dL   RDW, POC 13.9 %   Platelet Count, POC 289 142 - 424 K/uL   MPV 6.9 0 - 99.8 fL     EKG/XRAY:   Primary read interpreted by Dr. Marin Comment at [facility]. No acute fracture of knee or tib fib There is some DJD of knee There is soft tissue swelling later view of tib fib?    ASSESSMENT/PLAN: Encounter Diagnoses  Name Primary?  . Right leg pain Yes  . Right knee pain   . Essential hypertension   . Seasonal allergies   . Weight gain   . Sprain and strain   . Gastroesophageal reflux disease without esophagitis     Rachel Swanson is a pleasant morbidly obese African-American female who presents with a chronic history of right knee and leg pain 1 year. She has a history of reflux. She would prefer to not take any oral anti-inflammatories. She has had relief with full tan topical gel. Will prescribe her Voltaren gel as needed for right knee pain. She will also get information about [facility] [place] interested she will call me back and  we can start on the medication for weight loss. CBC and hemoglobin A1c were all within normal limits. She does not have diabetes. She still has thyroid and CMP labs pending. Follow-up in 6 months for hypertensio Encourage diet and exercise. She was like to try a weight loss medication but at this time I am not willing to prescribe her any stimulants. I have given her information on Belviq, if she is okay with taking the medication after reviewing the risk and benefits continue with  it. This is in the setting if her TSH and other labs are within normal limits. If she needs refills she can have refills for 6 months if she call in for her blood pressure medications.   Gross sideeffects, risk and benefits, and alternatives of medications d/w patient. Patient is aware that all medications have potential sideeffects and we are unable to predict every sideeffect or drug-drug interaction that may occur.  , [place], DO 12/05/2014 11:52 AM

## 2014-12-06 ENCOUNTER — Telehealth: Payer: Self-pay | Admitting: Family Medicine

## 2014-12-06 DIAGNOSIS — E876 Hypokalemia: Secondary | ICD-10-CM

## 2014-12-06 MED ORDER — POTASSIUM CHLORIDE CRYS ER 20 MEQ PO TBCR: 20.0000 meq | EXTENDED_RELEASE_TABLET | Freq: Every day | ORAL | Status: DC

## 2014-12-06 NOTE — Telephone Encounter (Signed)
Spoke to patient about labs, needs k supplement, will do for 2 weeks, recheck BMP, she cannot take NSAIDs orally due to GERD

## 2014-12-11 ENCOUNTER — Telehealth: Payer: Self-pay

## 2014-12-11 NOTE — Telephone Encounter (Signed)
Dr. Marin Comment  Patient wants a different cream the insurance company will not pay for it.

## 2014-12-14 ENCOUNTER — Encounter: Payer: Self-pay | Admitting: Family Medicine

## 2014-12-14 NOTE — Telephone Encounter (Signed)
Can we prescribe her something different?

## 2014-12-14 NOTE — Telephone Encounter (Signed)
Patient called back and stated she never received a return call.  Her pharmacy would not allow her to get the prescription prescribed by Dr. Marin Comment for her leg due to her having a GERD diagnosis.  Patient states she only has occasional heartburn.  Can we call her back and prescribe something different.  Patient made an appt. With Dr. Leward Quan for Thursday but would like to resolve this before then and cancel. [phone] [facility]acy is [facility] on [address].

## 2014-12-16 NOTE — Telephone Encounter (Signed)
I called the pharmacy. They will fill the voltaren gel for her. If she needs to be prescribed something different d/t insurance purposes, she should RTC to see Dr. Leward Quan tomorrow.

## 2014-12-17 ENCOUNTER — Encounter: Payer: Self-pay | Admitting: Family Medicine

## 2014-12-17 ENCOUNTER — Ambulatory Visit: Payer: BLUE CROSS/BLUE SHIELD | Admitting: Family Medicine

## 2015-01-18 ENCOUNTER — Other Ambulatory Visit: Payer: Self-pay | Admitting: Family Medicine

## 2015-01-18 DIAGNOSIS — Z1231 Encounter for screening mammogram for malignant neoplasm of breast: Secondary | ICD-10-CM

## 2015-01-26 ENCOUNTER — Ambulatory Visit (INDEPENDENT_AMBULATORY_CARE_PROVIDER_SITE_OTHER): Payer: BLUE CROSS/BLUE SHIELD | Admitting: Family Medicine

## 2015-01-26 ENCOUNTER — Encounter: Payer: Self-pay | Admitting: Family Medicine

## 2015-01-26 VITALS — BP 136/86 | HR 72 | Temp 98.8°F | Resp 16 | Ht 64.75 in | Wt 263.2 lb

## 2015-01-26 DIAGNOSIS — I10 Essential (primary) hypertension: Secondary | ICD-10-CM | POA: Diagnosis not present

## 2015-01-26 NOTE — Patient Instructions (Signed)
"  Eat Fat, Get Thin"- Dr. Crista Elliot can help you get on the road to better nutrition. As you have said, you have to commit to lifestyle changes.   You will be scheduled to see Tishira Brewington, PA- C in 3 months for complete physical and fasting labs.

## 2015-01-27 ENCOUNTER — Encounter: Payer: Self-pay | Admitting: Family Medicine

## 2015-01-27 NOTE — Progress Notes (Signed)
S: This 39 y.o. Female is here for HTN follow-up. She feels well and has started an exercise program. She was limited by knee pain but has a knee support and topical Voltaren gel is very effective for knee pain. She denies diaphoresis, vision disturbances, CP or tightness, palpitations, SOB or DOE, cough, edema, HA, dizziness, weakness, numbness or syncope.  Patient Active Problem List   Diagnosis Date Noted  . Unspecified vitamin D deficiency 03/19/2014  . Seasonal allergies 03/19/2014  . Essential hypertension 02/05/2014  . Obesity, unspecified 02/05/2014    Prior to Admission medications   Medication Sig Start Date End Date Taking? Authorizing Provider  diclofenac sodium (VOLTAREN) 1 % GEL Apply 4 g topically 4 (four) times daily. As needed 12/05/14  Yes Thao P Le, DO  fluticasone (FLONASE) 50 MCG/ACT nasal spray Place 2 sprays into both nostrils daily. 06/12/14  Yes Barton Fanny, MD  hydrochlorothiazide (HYDRODIURIL) 25 MG tablet Take 1 tablet (25 mg total) by mouth daily. 06/12/14  Yes Barton Fanny, MD  montelukast (SINGULAIR) 10 MG tablet Take 1 tablet (10 mg total) by mouth at bedtime. 06/12/14  Yes Barton Fanny, MD  verapamil (CALAN-SR) 180 MG CR tablet Take 1 tablet (180 mg total) by mouth at bedtime. 06/12/14  Yes Barton Fanny, MD  PRESCRIPTION MEDICATION     Historical Provider, MD    SURG, [facility] and FAM HX reviewed.  ROS: AS per HPI.  O: Filed Vitals:   01/26/15 1530  BP: 136/86  Pulse: 72  Temp: 98.8 F (37.1 C)  Resp: 16    GEN: In NAD; WN,WD. HENT: [place]/AT; otherwise unremarkable. COR: RRR. LUNGS: Normal resp rate and effort. SKIN; W&D; intact. MS: MAEs; no edema. NEURO: A&O x 3; CNs intact. Nonfocal.  A/P: Essential hypertension- Stable on current medications. Encouraged to continue w/ weight loss plan.

## 2015-02-16 ENCOUNTER — Ambulatory Visit (HOSPITAL_COMMUNITY)
Admission: RE | Admit: 2015-02-16 | Discharge: 2015-02-16 | Disposition: A | Discharge status: Home / Self Care | Payer: BLUE CROSS/BLUE SHIELD | Source: Ambulatory Visit | Attending: Family Medicine | Admitting: Family Medicine

## 2015-02-16 DIAGNOSIS — Z1231 Encounter for screening mammogram for malignant neoplasm of breast: Secondary | ICD-10-CM | POA: Insufficient documentation

## 2015-05-17 ENCOUNTER — Ambulatory Visit (INDEPENDENT_AMBULATORY_CARE_PROVIDER_SITE_OTHER): Payer: BLUE CROSS/BLUE SHIELD | Admitting: Physician Assistant

## 2015-05-17 VITALS — BP 124/80 | HR 80 | Temp 98.4°F | Resp 16 | Ht 65.0 in | Wt 256.8 lb

## 2015-05-17 DIAGNOSIS — E876 Hypokalemia: Secondary | ICD-10-CM | POA: Diagnosis not present

## 2015-05-17 DIAGNOSIS — M25561 Pain in right knee: Secondary | ICD-10-CM | POA: Diagnosis not present

## 2015-05-17 DIAGNOSIS — M79604 Pain in right leg: Secondary | ICD-10-CM | POA: Diagnosis not present

## 2015-05-17 DIAGNOSIS — J302 Other seasonal allergic rhinitis: Secondary | ICD-10-CM | POA: Diagnosis not present

## 2015-05-17 DIAGNOSIS — I10 Essential (primary) hypertension: Secondary | ICD-10-CM | POA: Diagnosis not present

## 2015-05-17 DIAGNOSIS — T148XXA Other injury of unspecified body region, initial encounter: Secondary | ICD-10-CM

## 2015-05-17 DIAGNOSIS — T148 Other injury of unspecified body region: Secondary | ICD-10-CM

## 2015-05-17 HISTORY — DX: Hypokalemia: E87.6

## 2015-05-17 MED ORDER — HYDROCHLOROTHIAZIDE 25 MG PO TABS: 25.0000 mg | ORAL_TABLET | Freq: Every day | ORAL | Status: DC

## 2015-05-17 MED ORDER — MOMETASONE FUROATE 50 MCG/ACT NA SUSP: 2.0000 | Freq: Two times a day (BID) | NASAL | Status: DC

## 2015-05-17 MED ORDER — DICLOFENAC SODIUM 1 % TD GEL: 4.0000 g | Freq: Four times a day (QID) | TRANSDERMAL | Status: DC

## 2015-05-17 MED ORDER — VERAPAMIL HCL ER 180 MG PO TBCR: 180.0000 mg | EXTENDED_RELEASE_TABLET | Freq: Every day | ORAL | Status: DC

## 2015-05-17 NOTE — Progress Notes (Signed)
   Subjective:    Patient ID: Sala Tague, female    DOB: July 02, 1976, 39 y.o.   MRN: [id number]  Chief Complaint  Patient presents with  . Medication Refill    hydrochlorothiazide25mg , voltaren 1%gel, singulair 10mg , flonase 63mcg, verapamil 180 mg   Medications, allergies, past medical history, surgical history, family history, social history and problem list reviewed and updated.  HPI  45 yof presents for med refills.   Pt of Dr Leward Quan, will be tx care to [facility] and is planning to see her this fall for a physical.   Pt needs above listed meds. Has been on hctz and verapamil for several yrs for htn. Has cough with lisinopril. States she also has intermittent LE edema which she takes hctz for as well. Last K was 3.3 5 months ago. Denies palps.   Review of Systems No fevers, chills, cp, sob.     Objective:   Physical Exam  Constitutional: She is oriented to person, place, and time. She appears well-developed and well-nourished.  Non-toxic appearance. She does not have a sickly appearance. She does not appear ill. No distress.  BP 124/80 mmHg  Pulse 80  Temp(Src) 98.4 F (36.9 C) (Oral)  Resp 16  Ht 5\' 5"  (1.651 m)  Wt 256 lb 12.8 oz (116.484 kg)  BMI 42.73 kg/m2  SpO2 97%  LMP 04/30/2015   Neurological: She is alert and oriented to person, place, and time.  Psychiatric: She has a normal mood and affect. Her speech is normal and behavior is normal.      Assessment & Plan:   Essential hypertension - Plan: hydrochlorothiazide (HYDRODIURIL) 25 MG tablet, verapamil (CALAN-SR) 180 MG CR tablet, Basic metabolic panel Hypokalemia - Plan: Basic metabolic panel --discussed htn options with pt, refilled verapamil and hctz --checking k today, if hypok persists will discuss options with pt including staying the course and starting k replacement or stopping hctz and replacing with losartan, pt wishes to stay on diuretic if possible for intermittent  edema  Seasonal allergies - Plan: mometasone (NASONEX) 50 MCG/ACT nasal spray  Right knee pain - Plan: diclofenac sodium (VOLTAREN) 1 % GEL Right leg pain - Plan: diclofenac sodium (VOLTAREN) 1 % GEL Sprain and strain - Plan: diclofenac sodium (VOLTAREN) 1 % GEL  Julieta Gutting, PA-C Physician Assistant-Certified [facility]  05/17/2015 9:14 PM

## 2015-05-17 NOTE — Patient Instructions (Signed)
I've refilled the voltaren gel. I've refilled the nasal steroid spray. I filled a different one as your insurance recommended a new one that is preferred for them.  I refilled the hctz and verapamil for one year each. We are checking labs today to look at your potassium level as the hctz can decrease your potassium.  I will let you know what your level is and recommend either a supplement or stopping the hctz altogether.

## 2015-05-18 LAB — BASIC METABOLIC PANEL
BUN: 11 mg/dL (ref 7–25)
CHLORIDE: 97 mmol/L — AB (ref 98–110)
CO2: 30 mmol/L (ref 20–31)
CREATININE: 0.91 mg/dL (ref 0.50–1.10)
Calcium: 9.8 mg/dL (ref 8.6–10.2)
Glucose, Bld: 94 mg/dL (ref 65–99)
Potassium: 3.4 mmol/L — ABNORMAL LOW (ref 3.5–5.3)
Sodium: 138 mmol/L (ref 135–146)

## 2015-05-21 MED ORDER — POTASSIUM CHLORIDE CRYS ER 20 MEQ PO TBCR: 20.0000 meq | EXTENDED_RELEASE_TABLET | Freq: Every day | ORAL | Status: DC

## 2015-05-21 NOTE — Addendum Note (Signed)
Addended byJulieta Gutting on: 05/21/2015 11:33 AM   Modules accepted: Orders

## 2015-07-12 ENCOUNTER — Other Ambulatory Visit: Payer: Self-pay

## 2015-07-12 MED ORDER — MONTELUKAST SODIUM 10 MG PO TABS: 10.0000 mg | ORAL_TABLET | Freq: Every day | ORAL | Status: DC

## 2015-07-17 ENCOUNTER — Ambulatory Visit (INDEPENDENT_AMBULATORY_CARE_PROVIDER_SITE_OTHER): Payer: BLUE CROSS/BLUE SHIELD | Admitting: Physician Assistant

## 2015-07-17 VITALS — BP 127/87 | HR 71 | Temp 98.4°F | Resp 17 | Ht 65.0 in | Wt 245.0 lb

## 2015-07-17 DIAGNOSIS — I10 Essential (primary) hypertension: Secondary | ICD-10-CM | POA: Diagnosis not present

## 2015-07-17 DIAGNOSIS — J302 Other seasonal allergic rhinitis: Secondary | ICD-10-CM | POA: Diagnosis not present

## 2015-07-17 DIAGNOSIS — M25561 Pain in right knee: Secondary | ICD-10-CM | POA: Diagnosis not present

## 2015-07-17 DIAGNOSIS — L723 Sebaceous cyst: Secondary | ICD-10-CM | POA: Diagnosis not present

## 2015-07-17 MED ORDER — MONTELUKAST SODIUM 10 MG PO TABS: 10.0000 mg | ORAL_TABLET | Freq: Every day | ORAL | Status: DC

## 2015-07-17 MED ORDER — HYDROCHLOROTHIAZIDE 25 MG PO TABS: 25.0000 mg | ORAL_TABLET | Freq: Every day | ORAL | Status: DC

## 2015-07-17 MED ORDER — DICLOFENAC SODIUM 1 % TD GEL: 4.0000 g | Freq: Four times a day (QID) | TRANSDERMAL | Status: DC

## 2015-07-17 MED ORDER — VERAPAMIL HCL ER 180 MG PO TBCR: 180.0000 mg | EXTENDED_RELEASE_TABLET | Freq: Every day | ORAL | Status: DC

## 2015-07-17 MED ORDER — MOMETASONE FUROATE 50 MCG/ACT NA SUSP: 2.0000 | Freq: Two times a day (BID) | NASAL | Status: DC

## 2015-07-17 NOTE — Progress Notes (Signed)
Subjective:    Patient ID: Rachel Swanson, female    DOB: October 03, 1975, 39 y.o.   MRN: [id number]  HPI Patient presents for cyst removal and refill of diclofenac gel, HCTZ, Singular, and verapamil.   Cyst has been on neck for past month and is not painful, has not grown in size, or changed in color. Wants it removed due to appearance. Denies fever, drainage, or redness.  Has been compliant with verapamil and HCTZ and has not had any side effects with current dose. Adds that she has been exercising this summer and has increased exercise to 4-5x per week for the past month. Also has been eating more salads, not eating any pork, and has cut back on eating beef. State that she has lost 25 lbs since she last saw Dr. Leward Quan. Denies SOB, CP, edema, palpitations, HA, or vision change.  Only needs diclofenac every other few days for knee pain. Pain does not radiate and legs are no longer present.   Allergies  Allergen Reactions  . Ace Inhibitors Cough   Review of Systems     Objective:   Physical Exam  Constitutional: She is oriented to person, place, and time. She appears well-developed and well-nourished. No distress.  Blood pressure 127/87, pulse 71, temperature 98.4 F (36.9 C), temperature source Oral, resp. rate 17, height 5\' 5"  (1.651 m), weight 245 lb (111.131 kg), last menstrual period 07/05/2015, SpO2 98 %.  HENT:  Head: Normocephalic and atraumatic.  Right Ear: External ear normal.  Left Ear: External ear normal.  Eyes: Conjunctivae are normal. Right eye exhibits no discharge. Left eye exhibits no discharge. No scleral icterus.  Neck: Normal range of motion. Neck supple.  Cardiovascular: Normal rate, regular rhythm, normal heart sounds and intact distal pulses.  Exam reveals no gallop and no friction rub.   No murmur heard. Pulmonary/Chest: Effort normal and breath sounds normal. No respiratory distress. She has no wheezes. She has no rales.  Musculoskeletal: She exhibits  no edema.  Lymphadenopathy:    She has no cervical adenopathy.  Neurological: She is alert and oriented to person, place, and time.  Skin: Skin is warm and dry. No rash noted. She is not diaphoretic. No erythema. No pallor.  Sebaceous cyst on left side of neck  Psychiatric: She has a normal mood and affect. Her behavior is normal. Judgment and thought content normal.   Wt Readings from Last 3 Encounters:  07/17/15 245 lb (111.131 kg)  05/17/15 256 lb 12.8 oz (116.484 kg)  01/26/15 263 lb 3.2 oz (119.387 kg)   Procedure Consent obtained. Iodine prep. 1/2 cc 1% lido local anesthesia. Small incision made and sebaceous material expressed. Wound explored and irrigated. Clean dressing placed.    Assessment & Plan:  1. Essential hypertension Continue lifestyle modifications. Has done a fantastic job. - verapamil (CALAN-SR) 180 MG CR tablet; Take 1 tablet (180 mg total) by mouth at bedtime.  Dispense: 90 tablet; Refill: 3 - hydrochlorothiazide (HYDRODIURIL) 25 MG tablet; Take 1 tablet (25 mg total) by mouth daily.  Dispense: 90 tablet; Refill: 3  2. Seasonal allergies - montelukast (SINGULAIR) 10 MG tablet; Take 1 tablet (10 mg total) by mouth at bedtime.  Dispense: 90 tablet; Refill: 2 - mometasone (NASONEX) 50 MCG/ACT nasal spray; Place 2 sprays into the nose 2 (two) times daily.  Dispense: 17 g; Refill: 3  3. Right knee pain - diclofenac sodium (VOLTAREN) 1 % GEL; Apply 4 g topically 4 (four) times daily. As needed  Dispense:  100 g; Refill: 3  4. Sebaceous cyst Small cyst. Did not pack due to size and depth of wound smaller than surface wound.    Alveta Heimlich PA-C  [facility] 07/17/2015 3:50 PM

## 2015-07-18 NOTE — Progress Notes (Signed)
  Medical screening examination/treatment/procedure(s) were performed by non-physician practitioner and as supervising physician I was immediately available for consultation/collaboration.     

## 2015-07-28 ENCOUNTER — Telehealth: Payer: Self-pay

## 2015-07-28 NOTE — Telephone Encounter (Signed)
Pharm faxed notice that mometasone NS needs PA. Completed on covermymeds and from questions it looks like ins prefers the name brand. I tried to call pharm but could not get through. Faxed back PA request with note to try running for name brand and send another notice if still doesn't cover it.

## 2016-02-11 ENCOUNTER — Other Ambulatory Visit: Payer: Self-pay

## 2016-02-11 DIAGNOSIS — Z1231 Encounter for screening mammogram for malignant neoplasm of breast: Secondary | ICD-10-CM

## 2016-03-09 ENCOUNTER — Ambulatory Visit
Admission: RE | Admit: 2016-03-09 | Discharge: 2016-03-09 | Disposition: A | Discharge status: Home / Self Care | Payer: BLUE CROSS/BLUE SHIELD | Source: Ambulatory Visit

## 2016-03-09 ENCOUNTER — Other Ambulatory Visit: Payer: Self-pay | Admitting: Internal Medicine

## 2016-03-09 DIAGNOSIS — Z1231 Encounter for screening mammogram for malignant neoplasm of breast: Secondary | ICD-10-CM

## 2016-03-13 DIAGNOSIS — Z6841 Body Mass Index (BMI) 40.0 and over, adult: Secondary | ICD-10-CM | POA: Insufficient documentation

## 2016-03-13 DIAGNOSIS — E6609 Other obesity due to excess calories: Secondary | ICD-10-CM | POA: Insufficient documentation

## 2016-04-10 DIAGNOSIS — K219 Gastro-esophageal reflux disease without esophagitis: Secondary | ICD-10-CM | POA: Insufficient documentation

## 2017-04-12 ENCOUNTER — Ambulatory Visit: Payer: BLUE CROSS/BLUE SHIELD | Admitting: Primary Care

## 2017-04-24 ENCOUNTER — Ambulatory Visit (INDEPENDENT_AMBULATORY_CARE_PROVIDER_SITE_OTHER): Payer: BLUE CROSS/BLUE SHIELD | Admitting: Primary Care

## 2017-04-24 ENCOUNTER — Encounter: Payer: Self-pay | Admitting: Primary Care

## 2017-04-24 VITALS — BP 124/84 | HR 73 | Temp 98.1°F | Ht 64.75 in | Wt 228.0 lb

## 2017-04-24 DIAGNOSIS — L659 Nonscarring hair loss, unspecified: Secondary | ICD-10-CM

## 2017-04-24 DIAGNOSIS — J302 Other seasonal allergic rhinitis: Secondary | ICD-10-CM

## 2017-04-24 DIAGNOSIS — E559 Vitamin D deficiency, unspecified: Secondary | ICD-10-CM | POA: Diagnosis not present

## 2017-04-24 DIAGNOSIS — I10 Essential (primary) hypertension: Secondary | ICD-10-CM | POA: Diagnosis not present

## 2017-04-24 DIAGNOSIS — J3089 Other allergic rhinitis: Secondary | ICD-10-CM | POA: Diagnosis not present

## 2017-04-24 DIAGNOSIS — M170 Bilateral primary osteoarthritis of knee: Secondary | ICD-10-CM | POA: Diagnosis not present

## 2017-04-24 LAB — COMPREHENSIVE METABOLIC PANEL
ALBUMIN: 3.9 g/dL (ref 3.5–5.2)
ALK PHOS: 51 U/L (ref 39–117)
ALT: 10 U/L (ref 0–35)
AST: 12 U/L (ref 0–37)
BILIRUBIN TOTAL: 0.5 mg/dL (ref 0.2–1.2)
BUN: 9 mg/dL (ref 6–23)
CALCIUM: 10.1 mg/dL (ref 8.4–10.5)
CO2: 29 meq/L (ref 19–32)
Chloride: 101 mEq/L (ref 96–112)
Creatinine, Ser: 0.95 mg/dL (ref 0.40–1.20)
GFR: 82.36 mL/min (ref >60.00)
Glucose, Bld: 134 mg/dL — ABNORMAL HIGH (ref 70–99)
Potassium: 3.4 mEq/L — ABNORMAL LOW (ref 3.5–5.1)
Sodium: 137 mEq/L (ref 135–145)
Total Protein: 7.6 g/dL (ref 6.0–8.3)

## 2017-04-24 LAB — VITAMIN D 25 HYDROXY (VIT D DEFICIENCY, FRACTURES): VITD: 18.52 ng/mL — ABNORMAL LOW (ref 30.00–100.00)

## 2017-04-24 LAB — TSH: TSH: 1.66 u[IU]/mL (ref 0.35–4.50)

## 2017-04-24 MED ORDER — VERAPAMIL HCL ER 240 MG PO TBCR: 240.0000 mg | EXTENDED_RELEASE_TABLET | Freq: Every day | ORAL | 3 refills | Status: DC

## 2017-04-24 MED ORDER — DICLOFENAC SODIUM 1 % TD GEL: 2.0000 g | Freq: Two times a day (BID) | TRANSDERMAL | 2 refills | Status: DC

## 2017-04-24 MED ORDER — MONTELUKAST SODIUM 10 MG PO TABS: 20.0000 mg | ORAL_TABLET | Freq: Every day | ORAL | 1 refills | Status: DC

## 2017-04-24 MED ORDER — HYDROCHLOROTHIAZIDE 25 MG PO TABS: 25.0000 mg | ORAL_TABLET | Freq: Every day | ORAL | 3 refills | Status: DC

## 2017-04-24 NOTE — Assessment & Plan Note (Signed)
Stable in the office today. Continue HCTZ and verapamil. Refills sent to pharmacy. BMP pending.

## 2017-04-24 NOTE — Assessment & Plan Note (Signed)
Commended her on her weight loss over the last 2 years, encouraged her to continue. Discussed that I do not prescribe or recommend weight loss medication. Recommended she see Dr. Leafy Ro to help with weight loss through diet and exercise. Phone number provided.

## 2017-04-24 NOTE — Assessment & Plan Note (Signed)
Discussed to try Singulair 10 mg with Allegra, rather than taking 20 mg of Singulair. She will try this and update.

## 2017-04-24 NOTE — Patient Instructions (Addendum)
I sent refills of your medications to your pharmacy.  Try taking your Singulair 10 mg with an Allegra/Zyrtec/Claritin tablet.  You will be contacted regarding your referral to Dermatology.  Please let us know if you have not heard back within one week.   Complete lab work prior to leaving today. I will notify you of your results once received.   Continue to work on improvements in diet and regular exercise. Start exercising. You should be getting 150 minutes of moderate intensity exercise weekly.  Dr. Dennard Nip at [facility]. [phone].  Please schedule a physical with me when due, perhaps in 6 months. You may also schedule a lab only appointment 3-4 days prior. We will discuss your lab results in detail during your physical.  It was a pleasure to meet you today! Please don't hesitate to call me with any questions. Welcome to [facility]!

## 2017-04-24 NOTE — Progress Notes (Signed)
Subjective:    Patient ID: Rachel Swanson, female    DOB: 04-05-1976, 41 y.o.   MRN: [id number]  HPI  Rachel Swanson is a 41 year old female who presents today to establish care and discuss the problems mentioned below. Will obtain old records.  1) Essential Hypertension: Currently managed on HCTZ 25 mg, verapamil 240 mg CR. Her BP in the office today is 124/84. She denies chest pain, dizziness, shortness of breath. She does not check her BP at home.  2) Allergic Rhinitis: Currently managed on Singulair 10 mg, takes 20 mg. Year round allergies. Symptoms include itchy/watery eyes, ears, throat. She does take 20 mg of her Singulair with relief. The 10 mg dose doesn't do anything for her. She's not tried taking Claritin/Zyrtec/Allegra along with her Singulair.  3) Hair Loss: Thinning to her hair at the front parietal lobes for years. Overall improvement with natural oils and OTC products, but not healing as fast as she'd like. Over the past 1 year she's been consistent with her treatment and has noticed re-growth of her hair. 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 TSH in March 2016 normal. History of vitamin D deficiency.  4) Obesity: Long history of obesity. Currently working on improving diet and regular exercise. She exercises three days weekly for 1-2 hours at a time. She's been using OTC "pre-workout" medications. She's looking for an appetite suppressant as this is one of the biggest problems.   Wt Readings from Last 3 Encounters:  04/24/17 228 lb (103.4 kg)  07/17/15 245 lb (111.1 kg)  05/17/15 256 lb 12.8 oz (116.5 kg)     Review of Systems  Constitutional: Negative for fatigue and unexpected weight change.  Eyes: Negative for visual disturbance.  Respiratory: Negative for shortness of breath.   Cardiovascular: Negative for chest pain.  Skin:       Hair loss, slow to re-grow  Neurological: Negative for dizziness.       Past Medical History:  Diagnosis  Date  . Hypertension      Social History   Social History  . Marital status: Single    Spouse name: N/A  . Number of children: N/A  . Years of education: N/A   Occupational History  . Not on file.   Social History Main Topics  . Smoking status: Never Smoker  . Smokeless tobacco: Never Used  . Alcohol use No  . Drug use: Unknown  . Sexual activity: Not on file   Other Topics Concern  . 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        No past surgical history on file.  Family History  Problem Relation Age of Onset  . Heart disease Mother   . Hyperlipidemia Father   . Diabetes Sister     Allergies  Allergen Reactions  . Ace Inhibitors Cough    No current outpatient prescriptions on file prior to visit.   No current facility-administered medications on file prior to visit.     BP 124/84   Pulse 73   Temp 98.1 F (36.7 C) (Oral)   Ht 5' 4.75" (1.645 m)   Wt 228 lb (103.4 kg)   LMP 04/09/2017   SpO2 97%   BMI 38.23 kg/m    Objective:   Physical Exam  Constitutional: She appears well-nourished.  Neck: Neck supple.  Cardiovascular: Normal rate and  regular rhythm.   Pulmonary/Chest: Effort normal and breath sounds normal.  Skin: Skin is warm and dry.  Thinning to hair line of frontal part of parietal lobes bilaterally. New hair growth present.  Psychiatric: She has a normal mood and affect.          Assessment & Plan:

## 2017-04-24 NOTE — Assessment & Plan Note (Signed)
Likely secondary to long term dread lock hair style. Check labs to rule out metabolic cause. Referral placed to dermatology for further evaluation.

## 2017-04-24 NOTE — Assessment & Plan Note (Signed)
Vitamin D pending today.

## 2017-04-26 ENCOUNTER — Ambulatory Visit (INDEPENDENT_AMBULATORY_CARE_PROVIDER_SITE_OTHER): Payer: BLUE CROSS/BLUE SHIELD | Admitting: Obstetrics & Gynecology

## 2017-04-26 ENCOUNTER — Encounter: Payer: Self-pay | Admitting: Obstetrics & Gynecology

## 2017-04-26 ENCOUNTER — Telehealth: Payer: Self-pay

## 2017-04-26 VITALS — BP 135/91 | HR 77 | Ht 64.5 in | Wt 228.0 lb

## 2017-04-26 DIAGNOSIS — Z01419 Encounter for gynecological examination (general) (routine) without abnormal findings: Secondary | ICD-10-CM | POA: Diagnosis not present

## 2017-04-26 DIAGNOSIS — Z1151 Encounter for screening for human papillomavirus (HPV): Secondary | ICD-10-CM | POA: Diagnosis not present

## 2017-04-26 DIAGNOSIS — Z124 Encounter for screening for malignant neoplasm of cervix: Secondary | ICD-10-CM | POA: Diagnosis not present

## 2017-04-26 DIAGNOSIS — Z1231 Encounter for screening mammogram for malignant neoplasm of breast: Secondary | ICD-10-CM

## 2017-04-26 NOTE — Progress Notes (Signed)
GYNECOLOGY ANNUAL PREVENTATIVE CARE ENCOUNTER NOTE  Subjective:   Rachel Swanson is a 41 y.o. No obstetric history on file. female here for a routine annual gynecologic exam.  Current complaints: none.   Denies abnormal vaginal bleeding, discharge, pelvic pain, problems with intercourse or other gynecologic concerns.    Gynecologic History Patient's last menstrual period was 04/09/2017. Contraception: abstinence Last Pap: 2016. Results were: normal Last mammogram: 2017. Results were: normal  Obstetric History OB History  No data available    Past Medical History:  Diagnosis Date  . Hypertension     Past Surgical History:  Procedure Laterality Date  . BUNIONECTOMY Right   . WISDOM TOOTH EXTRACTION      Current Outpatient Prescriptions on File Prior to Visit  Medication Sig Dispense Refill  . diclofenac sodium (VOLTAREN) 1 % GEL Apply 2 g topically 2 (two) times daily. 100 g 2  . hydrochlorothiazide (HYDRODIURIL) 25 MG tablet Take 1 tablet (25 mg total) by mouth daily. 90 tablet 3  . montelukast (SINGULAIR) 10 MG tablet Take 2 tablets (20 mg total) by mouth at bedtime. 180 tablet 1  . verapamil (CALAN-SR) 240 MG CR tablet Take 1 tablet (240 mg total) by mouth daily. 90 tablet 3   No current facility-administered medications on file prior to visit.     Allergies  Allergen Reactions  . Ace Inhibitors Cough    Social History   Social History  . Marital status: Single    Spouse name: N/A  . Number of children: N/A  . Years of education: N/A   Occupational History  . Not on file.   Social History Main Topics  . Smoking status: Never Smoker  . Smokeless tobacco: Never Used  . Alcohol use No  . Drug use: No  . Sexual activity: Not Currently    Partners: Male    Birth control/ protection: Condom   Other Topics Concern  . 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         Family History  Problem Relation Age of Onset  . Heart disease Mother   . Hyperlipidemia Father   . Heart attack Father   . Diabetes Sister   . Breast cancer Other     The following portions of the patient's history were reviewed and updated as appropriate: allergies, current medications, past family history, past medical history, past social history, past surgical history and problem list.  Review of Systems Pertinent items noted in HPI and remainder of comprehensive ROS otherwise negative.   Objective:  BP (!) 135/91   Pulse 77   Ht 5' 4.5" (1.638 m)   Wt 228 lb (103.4 kg)   LMP 04/09/2017   BMI 38.53 kg/m  CONSTITUTIONAL: Well-developed, well-nourished female in no acute distress.  HENT:  Normocephalic, atraumatic, External right and left ear normal. Oropharynx is clear and moist EYES: Conjunctivae and EOM are normal. Pupils are equal, round, and reactive to light. No scleral icterus.  NECK: Normal range of motion, supple, no masses.  Normal thyroid.  SKIN: Skin is warm and dry. No rash noted. Not diaphoretic. No erythema. No pallor. NEUROLOGIC: Alert and oriented to person, place, and time. Normal reflexes, muscle tone coordination. No cranial nerve deficit noted. PSYCHIATRIC: Normal mood and affect. Normal behavior. Normal judgment and thought content. CARDIOVASCULAR: Normal heart rate noted, regular rhythm RESPIRATORY: Clear to auscultation bilaterally. Effort and  breath sounds normal, no problems with respiration noted. BREASTS: Symmetric in size. No masses, skin changes, nipple drainage, or lymphadenopathy. ABDOMEN: Soft, normal bowel sounds, no distention noted.  No tenderness, rebound or guarding.  PELVIC: Normal appearing external genitalia; normal appearing vaginal mucosa and cervix.  No abnormal discharge noted.  Pap smear obtained.  Normal uterine size, no other palpable masses, no uterine or adnexal tenderness. MUSCULOSKELETAL: Normal range of motion. No  tenderness.  No cyanosis, clubbing, or edema.  2+ distal pulses.   Assessment and Plan:  1. Encounter for gynecological examination with Papanicolaou smear of cervix Will follow up results of pap smear and manage accordingly. - Cytology - PAP  2. Encounter for screening mammogram for breast cancer - MM SCREENING BREAST TOMO BILATERAL; Future Mammogram scheduled  Routine preventative health maintenance measures emphasized. Please refer to After Visit Summary for other counseling recommendations.    Verita Schneiders, MD, [place] Attending Obstetrician & Gynecologist, [place] [facility]

## 2017-04-26 NOTE — Telephone Encounter (Signed)
Rachel Swanson at [facility] left v/m; received singulair 10 mg rx with instructions take 2 tabs at hs. This is greater than recommended dosage of 10 mg daily. Rachel Swanson request prior auth for singulair. Call # [phone]. Rachel Swanson request cb.

## 2017-04-26 NOTE — Patient Instructions (Signed)
Preventive Care 40-64 Years, Female Preventive care refers to lifestyle choices and visits with your health care provider that can promote health and wellness. What does preventive care include?  A yearly physical exam. This is also called an annual well check.  Dental exams once or twice a year.  Routine eye exams. Ask your health care provider how often you should have your eyes checked.  Personal lifestyle choices, including: ? Daily care of your teeth and gums. ? Regular physical activity. ? Eating a healthy diet. ? Avoiding tobacco and drug use. ? Limiting alcohol use. ? Practicing safe sex. ? Taking low-dose aspirin daily starting at age 58. ? Taking vitamin and mineral supplements as recommended by your health care provider. What happens during an annual well check? The services and screenings done by your health care provider during your annual well check will depend on your age, overall health, lifestyle risk factors, and family history of disease. Counseling Your health care provider may ask you questions about your:  Alcohol use.  Tobacco use.  Drug use.  Emotional well-being.  Home and relationship well-being.  Sexual activity.  Eating habits.  Work and work Statistician.  Method of birth control.  Menstrual cycle.  Pregnancy history.  Screening You may have the following tests or measurements:  Height, weight, and BMI.  Blood pressure.  Lipid and cholesterol levels. These may be checked every 5 years, or more frequently if you are over 81 years old.  Skin check.  Lung cancer screening. You may have this screening every year starting at age 78 if you have a 30-pack-year history of smoking and currently smoke or have quit within the past 15 years.  Fecal occult blood test (FOBT) of the stool. You may have this test every year starting at age 65.  Flexible sigmoidoscopy or colonoscopy. You may have a sigmoidoscopy every 5 years or a colonoscopy  every 10 years starting at age 30.  Hepatitis C blood test.  Hepatitis B blood test.  Sexually transmitted disease (STD) testing.  Diabetes screening. This is done by checking your blood sugar (glucose) after you have not eaten for a while (fasting). You may have this done every 1-3 years.  Mammogram. This may be done every 1-2 years. Talk to your health care provider about when you should start having regular mammograms. This may depend on whether you have a family history of breast cancer.  BRCA-related cancer screening. This may be done if you have a family history of breast, ovarian, tubal, or peritoneal cancers.  Pelvic exam and Pap test. This may be done every 3 years starting at age 80. Starting at age 36, this may be done every 5 years if you have a Pap test in combination with an HPV test.  Bone density scan. This is done to screen for osteoporosis. You may have this scan if you are at high risk for osteoporosis.  Discuss your test results, treatment options, and if necessary, the need for more tests with your health care provider. Vaccines Your health care provider may recommend certain vaccines, such as:  Influenza vaccine. This is recommended every year.  Tetanus, diphtheria, and acellular pertussis (Tdap, Td) vaccine. You may need a Td booster every 10 years.  Varicella vaccine. You may need this if you have not been vaccinated.  Zoster vaccine. You may need this after age 5.  Measles, mumps, and rubella (MMR) vaccine. You may need at least one dose of MMR if you were born in  1957 or later. You may also need a second dose.  Pneumococcal 13-valent conjugate (PCV13) vaccine. You may need this if you have certain conditions and were not previously vaccinated.  Pneumococcal polysaccharide (PPSV23) vaccine. You may need one or two doses if you smoke cigarettes or if you have certain conditions.  Meningococcal vaccine. You may need this if you have certain  conditions.  Hepatitis A vaccine. You may need this if you have certain conditions or if you travel or work in places where you may be exposed to hepatitis A.  Hepatitis B vaccine. You may need this if you have certain conditions or if you travel or work in places where you may be exposed to hepatitis B.  Haemophilus influenzae type b (Hib) vaccine. You may need this if you have certain conditions.  Talk to your health care provider about which screenings and vaccines you need and how often you need them. This information is not intended to replace advice given to you by your health care provider. Make sure you discuss any questions you have with your health care provider. Document Released: 10/08/2015 Document Revised: 05/31/2016 Document Reviewed: 07/13/2015 Elsevier Interactive Patient Education  2017 [employer].

## 2017-04-26 NOTE — Telephone Encounter (Signed)
Already sent PA on the Rx. Waiting on response.

## 2017-04-27 ENCOUNTER — Ambulatory Visit
Admission: RE | Admit: 2017-04-27 | Discharge: 2017-04-27 | Disposition: A | Discharge status: Home / Self Care | Payer: BLUE CROSS/BLUE SHIELD | Source: Ambulatory Visit | Attending: Obstetrics & Gynecology | Admitting: Obstetrics & Gynecology

## 2017-04-27 DIAGNOSIS — Z1231 Encounter for screening mammogram for malignant neoplasm of breast: Secondary | ICD-10-CM

## 2017-04-30 LAB — CYTOLOGY - PAP
Diagnosis: NEGATIVE
HPV (WINDOPATH): NOT DETECTED

## 2017-05-25 ENCOUNTER — Other Ambulatory Visit: Payer: BLUE CROSS/BLUE SHIELD

## 2017-06-01 ENCOUNTER — Other Ambulatory Visit: Payer: Self-pay | Admitting: Primary Care

## 2017-06-01 DIAGNOSIS — E876 Hypokalemia: Secondary | ICD-10-CM

## 2017-06-08 ENCOUNTER — Other Ambulatory Visit: Payer: BLUE CROSS/BLUE SHIELD

## 2017-08-29 ENCOUNTER — Other Ambulatory Visit: Payer: Self-pay | Admitting: Primary Care

## 2017-08-29 DIAGNOSIS — M170 Bilateral primary osteoarthritis of knee: Secondary | ICD-10-CM

## 2017-09-27 ENCOUNTER — Other Ambulatory Visit: Payer: Self-pay | Admitting: Primary Care

## 2017-09-27 DIAGNOSIS — M170 Bilateral primary osteoarthritis of knee: Secondary | ICD-10-CM

## 2017-10-26 ENCOUNTER — Other Ambulatory Visit: Payer: Self-pay | Admitting: Primary Care

## 2017-10-26 DIAGNOSIS — M170 Bilateral primary osteoarthritis of knee: Secondary | ICD-10-CM

## 2017-12-12 ENCOUNTER — Other Ambulatory Visit: Payer: Self-pay | Admitting: Primary Care

## 2017-12-12 DIAGNOSIS — M170 Bilateral primary osteoarthritis of knee: Secondary | ICD-10-CM

## 2018-01-16 ENCOUNTER — Ambulatory Visit: Payer: BLUE CROSS/BLUE SHIELD | Admitting: Primary Care

## 2018-01-16 ENCOUNTER — Encounter: Payer: Self-pay | Admitting: Primary Care

## 2018-01-16 VITALS — BP 130/92 | HR 70 | Temp 98.2°F | Ht 64.5 in | Wt 243.8 lb

## 2018-01-16 DIAGNOSIS — R739 Hyperglycemia, unspecified: Secondary | ICD-10-CM

## 2018-01-16 DIAGNOSIS — E876 Hypokalemia: Secondary | ICD-10-CM | POA: Diagnosis not present

## 2018-01-16 DIAGNOSIS — I10 Essential (primary) hypertension: Secondary | ICD-10-CM | POA: Diagnosis not present

## 2018-01-16 DIAGNOSIS — E559 Vitamin D deficiency, unspecified: Secondary | ICD-10-CM

## 2018-01-16 DIAGNOSIS — J302 Other seasonal allergic rhinitis: Secondary | ICD-10-CM | POA: Diagnosis not present

## 2018-01-16 MED ORDER — FLUTICASONE PROPIONATE 50 MCG/ACT NA SUSP
1.0000 | Freq: Two times a day (BID) | NASAL | 3 refills | Status: DC
Start: 2018-01-16 — End: 2018-04-30

## 2018-01-16 MED ORDER — AMLODIPINE BESYLATE 10 MG PO TABS: ORAL_TABLET | ORAL | 0 refills | Status: DC

## 2018-01-16 NOTE — Assessment & Plan Note (Signed)
Above goal in the office today, also on prior visits and home readings. Replace verapamil with Amlodipine 10 mg. Continue HCTZ 25 mg tablets.   Check BMP today. Follow up in 2 weeks for BP check. Consider oral potassium vs switching HCTZ if required.

## 2018-01-16 NOTE — Assessment & Plan Note (Signed)
Repeat Vitamin D level pending.   

## 2018-01-16 NOTE — Assessment & Plan Note (Signed)
Doing well on Singulair 10 mg and Flonase, continue same.

## 2018-01-16 NOTE — Patient Instructions (Signed)
Stop Verapamil for high blood pressure, start Amlodipine 10 mg tablet for high blood pressure.   Continue taking hydrochlorothiazide 25 mg tablets for high blood pressure.  Stop by the lab prior to leaving today. I will notify you of your results once received.   Schedule a follow up visit in 2-3 weeks for re-evaluation of blood pressure.  It was a pleasure to see you today!

## 2018-01-16 NOTE — Progress Notes (Signed)
Subjective:    Patient ID: Julane Crock, female    DOB: [birth year], 42 y.o.   MRN: [id number]  HPI  Ms. Lufkin is a 42 year old female who presents today for follow up  1) Essential Hypertension: Currently managed on HCTZ 25 mg and verapamil SR 240 mg. She is checking her blood pressure daily at home and is getting readings of 150's/90-100's. She's been under a lot of stress with her mother who recently moved in, also with her occupation.   She was once managed on oral potassium daily by her prior PCP, has not taken in over one year. She does not take OTC potassium.   She denies chest pain, dizziness, headaches.  BP Readings from Last 3 Encounters:  01/16/18 (!) 130/92  04/26/17 (!) 135/91  04/24/17 124/84     2) Seasonal Allergies: Currently managed on Singulair 10 mg, Flonase. Feels well managed on this regimen.   Review of Systems  Respiratory: Negative for shortness of breath.   Cardiovascular: Negative for chest pain.  Allergic/Immunologic: Positive for environmental allergies.  Neurological: Negative for dizziness and headaches.       Past Medical History:  Diagnosis Date  . Hypertension      Social History   Socioeconomic History  . Marital status: Single    Spouse name: Not on file  . Number of children: Not on file  . Years of education: Not on file  . Highest education level: Not on file  Occupational History  . Not on file  Social Needs  . Financial resource strain: Not on file  . Food insecurity:    Worry: Not on file    Inability: Not on file  . Transportation needs:    Medical: Not on file    Non-medical: Not on file  Tobacco Use  . Smoking status: Never Smoker  . Smokeless tobacco: Never Used  Substance and Sexual Activity  . Alcohol use: No  . Drug use: No  . Sexual activity: Not Currently    Partners: Male    Birth control/protection: Condom  Lifestyle  . Physical activity:    Days per week: Not on file    Minutes per  session: Not on file  . Stress: Not on file  Relationships  . Social connections:    Talks on phone: Not on file    Gets together: Not on file    Attends religious service: Not on file    Active member of club or organization: Not on file    Attends meetings of clubs or organizations: Not on file    Relationship status: Not on file  . Intimate partner violence:    Fear of current or ex partner: Not on file    Emotionally abused: Not on file    Physically abused: Not on file    Forced sexual activity: Not on file  Other Topics Concern  . 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     Past Surgical History:  Procedure Laterality Date  . BUNIONECTOMY Right   . WISDOM TOOTH EXTRACTION      Family History  Problem Relation Age of Onset  . Heart disease Mother   . Hyperlipidemia Father   . Heart attack Father   . Diabetes Sister   . Breast cancer Paternal Aunt   . Breast cancer Cousin        paternal  Allergies  Allergen Reactions  . Ace Inhibitors Cough    Current Outpatient Medications on File Prior to Visit  Medication Sig Dispense Refill  . diclofenac sodium (VOLTAREN) 1 % GEL APPLY 2 GRAMS TOPICALLY 2 (TWO) TIMES DAILY. 100 g 1  . hydrochlorothiazide (HYDRODIURIL) 25 MG tablet Take 1 tablet (25 mg total) by mouth daily. 90 tablet 3  . montelukast (SINGULAIR) 10 MG tablet Take 2 tablets (20 mg total) by mouth at bedtime. 180 tablet 1   No current facility-administered medications on file prior to visit.     BP (!) 130/92 (BP Location: Left Arm, Patient Position: Sitting, Cuff Size: Large)   Pulse 70   Temp 98.2 F (36.8 C) (Oral)   Ht 5' 4.5" (1.638 m)   Wt 243 lb 12 oz (110.6 kg)   LMP 01/13/2018   SpO2 99%   BMI 41.19 kg/m    Objective:   Physical Exam  Constitutional: She appears well-nourished.  Neck: Neck supple.  Cardiovascular: Normal rate and regular rhythm.    Pulmonary/Chest: Effort normal and breath sounds normal.  Skin: Skin is warm and dry.          Assessment & Plan:

## 2018-01-16 NOTE — Assessment & Plan Note (Signed)
Repeat potassium levels pending. Has been off of oral potassium for over one year.

## 2018-01-17 ENCOUNTER — Other Ambulatory Visit: Payer: Self-pay | Admitting: Primary Care

## 2018-01-17 DIAGNOSIS — R7303 Prediabetes: Secondary | ICD-10-CM

## 2018-01-17 LAB — COMPREHENSIVE METABOLIC PANEL
ALK PHOS: 57 U/L (ref 39–117)
ALT: 13 U/L (ref 0–35)
AST: 15 U/L (ref 0–37)
Albumin: 4 g/dL (ref 3.5–5.2)
BUN: 12 mg/dL (ref 6–23)
CALCIUM: 10.4 mg/dL (ref 8.4–10.5)
CO2: 33 mEq/L — ABNORMAL HIGH (ref 19–32)
Chloride: 99 mEq/L (ref 96–112)
Creatinine, Ser: 0.97 mg/dL (ref 0.40–1.20)
GFR: 80.13 mL/min (ref >60.00)
GLUCOSE: 82 mg/dL (ref 70–99)
POTASSIUM: 3.7 meq/L (ref 3.5–5.1)
Sodium: 137 mEq/L (ref 135–145)
TOTAL PROTEIN: 7.8 g/dL (ref 6.0–8.3)
Total Bilirubin: 0.3 mg/dL (ref 0.2–1.2)

## 2018-01-17 LAB — LIPID PANEL
Cholesterol: 172 mg/dL (ref 0–200)
HDL: 62.3 mg/dL (ref >39.00)
LDL Cholesterol: 95 mg/dL (ref 0–99)
NONHDL: 109.48
TRIGLYCERIDES: 72 mg/dL (ref 0.0–149.0)
Total CHOL/HDL Ratio: 3
VLDL: 14.4 mg/dL (ref 0.0–40.0)

## 2018-01-17 LAB — HEMOGLOBIN A1C: HEMOGLOBIN A1C: 5.7 % (ref 4.6–6.5)

## 2018-01-17 LAB — VITAMIN D 25 HYDROXY (VIT D DEFICIENCY, FRACTURES): VITD: 28.53 ng/mL — AB (ref 30.00–100.00)

## 2018-01-23 ENCOUNTER — Encounter: Payer: Self-pay | Admitting: Primary Care

## 2018-02-04 ENCOUNTER — Ambulatory Visit: Payer: BLUE CROSS/BLUE SHIELD | Admitting: Primary Care

## 2018-02-04 DIAGNOSIS — R7303 Prediabetes: Secondary | ICD-10-CM | POA: Diagnosis not present

## 2018-02-04 DIAGNOSIS — E559 Vitamin D deficiency, unspecified: Secondary | ICD-10-CM | POA: Diagnosis not present

## 2018-02-04 DIAGNOSIS — I10 Essential (primary) hypertension: Secondary | ICD-10-CM

## 2018-02-04 MED ORDER — AMLODIPINE BESYLATE 10 MG PO TABS: ORAL_TABLET | ORAL | 3 refills | Status: DC

## 2018-02-04 NOTE — Patient Instructions (Signed)
Start taking Vitamin D 1000 units once daily.  Continue taking Amlodipine 10 mg and Hydrochlorothiazide 25 mg tablets for blood pressure.  Continue exercising. You should be getting 150 minutes of moderate intensity exercise weekly.  Schedule a lab only appointment for late October 2019 to repeat the diabetes test and vitamin D.   It was a pleasure to see you today!

## 2018-02-04 NOTE — Assessment & Plan Note (Signed)
Discussed to start taking Vitamin D 1000 units once daily.

## 2018-02-04 NOTE — Assessment & Plan Note (Signed)
Recent A1C of 5.7. Repeat A1C in late October 2019. Commended her on getting back into the gym and working on diet.

## 2018-02-04 NOTE — Assessment & Plan Note (Signed)
Improved since switching to Amlodipine 10 mg. Continue Amlodipine and HCTZ. BMP from last visit stable.

## 2018-02-04 NOTE — Progress Notes (Signed)
Subjective:    Patient ID: Rachel Swanson, female    DOB: August 15, 1976, 42 y.o.   MRN: [id number]  HPI  Rachel Swanson is a 42 year old female who presents today for follow up of hypertension.   She is currently managed on Amlodipine 10 mg and HCTZ 25 mg. During her last visit her verapamil was switched to Amlodipine given continued elevated readings and complaints of headaches.  BP Readings from Last 3 Encounters:  02/04/18 118/70  01/16/18 (!) 130/92  04/26/17 (!) 135/91   Since her last visit her headaches have resolved. She's now back in the gym exercising and is eating healthier. She's checking her BP at home and is getting readings of 120's/70's. She denies headaches, chest pain, dizziness, ankle edema.   Review of Systems  Respiratory: Negative for shortness of breath.   Cardiovascular: Negative for chest pain and leg swelling.  Neurological: Negative for dizziness and headaches.       Past Medical History:  Diagnosis Date  . Hypertension   . Seasonal allergies      Social History   Socioeconomic History  . Marital status: Single    Spouse name: Not on file  . Number of children: Not on file  . Years of education: Not on file  . Highest education level: Not on file  Occupational History  . Not on file  Social Needs  . Financial resource strain: Not on file  . Food insecurity:    Worry: Not on file    Inability: Not on file  . Transportation needs:    Medical: Not on file    Non-medical: Not on file  Tobacco Use  . Smoking status: Never Smoker  . Smokeless tobacco: Never Used  Substance and Sexual Activity  . Alcohol use: No  . Drug use: No  . Sexual activity: Not Currently    Partners: Male    Birth control/protection: Condom  Lifestyle  . Physical activity:    Days per week: Not on file    Minutes per session: Not on file  . Stress: Not on file  Relationships  . Social connections:    Talks on phone: Not on file    Gets together: Not on  file    Attends religious service: Not on file    Active member of club or organization: Not on file    Attends meetings of clubs or organizations: Not on file    Relationship status: Not on file  . Intimate partner violence:    Fear of current or ex partner: Not on file    Emotionally abused: Not on file    Physically abused: Not on file    Forced sexual activity: Not on file  Other Topics Concern  . 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     Past Surgical History:  Procedure Laterality Date  . BUNIONECTOMY Right   . WISDOM TOOTH EXTRACTION      Family History  Problem Relation Age of Onset  . Heart disease Mother   . Hyperlipidemia Father   . Heart attack Father   . Diabetes Sister   . Breast cancer Paternal Aunt   . Breast cancer Cousin        paternal    Allergies  Allergen Reactions  . Ace Inhibitors Cough    Current Outpatient Medications on File Prior to Visit  Medication Sig Dispense  Refill  . amLODipine (NORVASC) 10 MG tablet Take 1 tablet by mouth once daily for blood pressure. 30 tablet 0  . diclofenac sodium (VOLTAREN) 1 % GEL APPLY 2 GRAMS TOPICALLY 2 (TWO) TIMES DAILY. 100 g 1  . fluticasone (FLONASE) 50 MCG/ACT nasal spray Place 1 spray into both nostrils 2 (two) times daily. 16 g 3  . hydrochlorothiazide (HYDRODIURIL) 25 MG tablet Take 1 tablet (25 mg total) by mouth daily. 90 tablet 3  . montelukast (SINGULAIR) 10 MG tablet Take 2 tablets (20 mg total) by mouth at bedtime. 180 tablet 1   No current facility-administered medications on file prior to visit.     BP 118/70   Pulse 69   Temp 98 F (36.7 C) (Oral)   Ht 5' 4.5" (1.638 m)   Wt 245 lb (111.1 kg)   LMP 01/13/2018   SpO2 99%   BMI 41.40 kg/m    Objective:   Physical Exam  Constitutional: She appears well-nourished.  Neck: Neck supple.  Cardiovascular: Normal rate and regular rhythm.    Pulmonary/Chest: Effort normal and breath sounds normal.  Skin: Skin is warm and dry.          Assessment & Plan:

## 2018-03-07 ENCOUNTER — Other Ambulatory Visit: Payer: Self-pay | Admitting: Primary Care

## 2018-03-07 DIAGNOSIS — J302 Other seasonal allergic rhinitis: Secondary | ICD-10-CM

## 2018-04-12 ENCOUNTER — Other Ambulatory Visit: Payer: Self-pay | Admitting: Primary Care

## 2018-04-12 DIAGNOSIS — Z1231 Encounter for screening mammogram for malignant neoplasm of breast: Secondary | ICD-10-CM

## 2018-04-16 ENCOUNTER — Ambulatory Visit: Payer: BLUE CROSS/BLUE SHIELD

## 2018-04-17 ENCOUNTER — Ambulatory Visit (INDEPENDENT_AMBULATORY_CARE_PROVIDER_SITE_OTHER): Payer: BLUE CROSS/BLUE SHIELD

## 2018-04-17 DIAGNOSIS — Z111 Encounter for screening for respiratory tuberculosis: Secondary | ICD-10-CM | POA: Diagnosis not present

## 2018-04-25 ENCOUNTER — Encounter: Payer: Self-pay | Admitting: Primary Care

## 2018-04-25 ENCOUNTER — Ambulatory Visit (INDEPENDENT_AMBULATORY_CARE_PROVIDER_SITE_OTHER): Payer: BLUE CROSS/BLUE SHIELD | Admitting: Primary Care

## 2018-04-25 VITALS — BP 118/68 | HR 64 | Temp 97.7°F | Ht 64.5 in | Wt 242.0 lb

## 2018-04-25 DIAGNOSIS — R7303 Prediabetes: Secondary | ICD-10-CM

## 2018-04-25 DIAGNOSIS — E559 Vitamin D deficiency, unspecified: Secondary | ICD-10-CM | POA: Diagnosis not present

## 2018-04-25 DIAGNOSIS — I10 Essential (primary) hypertension: Secondary | ICD-10-CM

## 2018-04-25 DIAGNOSIS — M25562 Pain in left knee: Secondary | ICD-10-CM

## 2018-04-25 DIAGNOSIS — Z0000 Encounter for general adult medical examination without abnormal findings: Secondary | ICD-10-CM | POA: Diagnosis not present

## 2018-04-25 DIAGNOSIS — K219 Gastro-esophageal reflux disease without esophagitis: Secondary | ICD-10-CM

## 2018-04-25 DIAGNOSIS — Z6841 Body Mass Index (BMI) 40.0 and over, adult: Secondary | ICD-10-CM

## 2018-04-25 DIAGNOSIS — J302 Other seasonal allergic rhinitis: Secondary | ICD-10-CM

## 2018-04-25 DIAGNOSIS — G8929 Other chronic pain: Secondary | ICD-10-CM | POA: Insufficient documentation

## 2018-04-25 DIAGNOSIS — Z0001 Encounter for general adult medical examination with abnormal findings: Secondary | ICD-10-CM | POA: Insufficient documentation

## 2018-04-25 DIAGNOSIS — J3089 Other allergic rhinitis: Secondary | ICD-10-CM

## 2018-04-25 DIAGNOSIS — M25561 Pain in right knee: Secondary | ICD-10-CM

## 2018-04-25 LAB — HEMOGLOBIN A1C: Hgb A1c MFr Bld: 5.7 % (ref 4.6–6.5)

## 2018-04-25 LAB — VITAMIN D 25 HYDROXY (VIT D DEFICIENCY, FRACTURES): VITD: 25.62 ng/mL — ABNORMAL LOW (ref 30.00–100.00)

## 2018-04-25 MED ORDER — MONTELUKAST SODIUM 10 MG PO TABS: 10.0000 mg | ORAL_TABLET | Freq: Every day | ORAL | 1 refills | Status: DC

## 2018-04-25 MED ORDER — HYDROCHLOROTHIAZIDE 25 MG PO TABS: 25.0000 mg | ORAL_TABLET | Freq: Every day | ORAL | 3 refills | Status: DC

## 2018-04-25 NOTE — Assessment & Plan Note (Signed)
Stable in the office today, continue Amlodipine and HCTZ. BMP unremarkable.  

## 2018-04-25 NOTE — Assessment & Plan Note (Signed)
Td UTD. Pap smear UTD.  Mammogram scheduled. Pending. Commended her on weight loss efforts, encouraged her to continue.  Exam unremarkable. Labs stable. Follow up in 1 year for CPE.

## 2018-04-25 NOTE — Assessment & Plan Note (Signed)
Using Singulair once nightly, using Flonase daily. Much improvement in symptoms. Continue same.

## 2018-04-25 NOTE — Assessment & Plan Note (Signed)
A1C stable. Continue to work on weight loss, repeat in 6 months.

## 2018-04-25 NOTE — Assessment & Plan Note (Signed)
She is working on weight loss, commended her for this.

## 2018-04-25 NOTE — Assessment & Plan Note (Signed)
Remains low, encouraged Vitamin D use.

## 2018-04-25 NOTE — Assessment & Plan Note (Signed)
More constant, using apple cider vinegar without improvement. Discussed to stop as this may cause irritation. Discussed Zantac vs Pepcid to start, update if no improvement.

## 2018-04-25 NOTE — Progress Notes (Signed)
Subjective:    Patient ID: Rachel Swanson, female    DOB: Apr 09, 1976, 42 y.o.   MRN: [id number]  HPI  Rachel Swanson is a 42 year old female who presents today for complete physical.  Immunizations: -Tetanus: Completed in 2016 -Influenza: Did not complete last season   Diet: She endorses a fair diet.  Breakfast: Skips sometimes, eggs, [place] sausage, fruit Lunch: Salad, baked chicken Dinner: Baked chicken, salad, some fried food, vegetables Snacks: Occasionally, potato chips, craisins, dry fruit, sun-dried tomatoes Desserts: Ice cream, cookies, 2-3 times weekly  Beverages: Water, low calorie juices, diet soda  Exercise: Recently started exercising at the gym, three days weekly. Eye exam: Completed in 2019 Dental exam: Completes annually  Pap Smear: Completed in 2018 Mammogram: Scheduled for August 2019  BP Readings from Last 3 Encounters:  04/25/18 118/68  02/04/18 118/70  01/16/18 (!) 130/92   Wt Readings from Last 3 Encounters:  04/25/18 242 lb (109.8 kg)  02/04/18 245 lb (111.1 kg)  01/16/18 243 lb 12 oz (110.6 kg)     Review of Systems  Constitutional: Negative for unexpected weight change.  HENT: Negative for rhinorrhea.   Respiratory: Negative for cough and shortness of breath.   Cardiovascular: Negative for chest pain.  Gastrointestinal: Negative for constipation and diarrhea.       GERD  Genitourinary: Negative for difficulty urinating and menstrual problem.  Musculoskeletal: Negative for arthralgias and myalgias.  Skin: Negative for rash.  Allergic/Immunologic: Positive for environmental allergies.  Neurological: Negative for dizziness, numbness and headaches.  Psychiatric/Behavioral: The patient is not nervous/anxious.        Past Medical History:  Diagnosis Date  . Hypertension   . Seasonal allergies      Social History   Socioeconomic History  . Marital status: Single    Spouse name: Not on file  . Number of children: Not on file    . Years of education: Not on file  . Highest education level: Not on file  Occupational History  . Not on file  Social Needs  . Financial resource strain: Not on file  . Food insecurity:    Worry: Not on file    Inability: Not on file  . Transportation needs:    Medical: Not on file    Non-medical: Not on file  Tobacco Use  . Smoking status: Never Smoker  . Smokeless tobacco: Never Used  Substance and Sexual Activity  . Alcohol use: No  . Drug use: No  . Sexual activity: Not Currently    Partners: Male    Birth control/protection: Condom  Lifestyle  . Physical activity:    Days per week: Not on file    Minutes per session: Not on file  . Stress: Not on file  Relationships  . Social connections:    Talks on phone: Not on file    Gets together: Not on file    Attends religious service: Not on file    Active member of club or organization: Not on file    Attends meetings of clubs or organizations: Not on file    Relationship status: Not on file  . Intimate partner violence:    Fear of current or ex partner: Not on file    Emotionally abused: Not on file    Physically abused: Not on file    Forced sexual activity: Not on file  Other Topics Concern  . 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     Past Surgical History:  Procedure Laterality Date  . BUNIONECTOMY Right   . WISDOM TOOTH EXTRACTION      Family History  Problem Relation Age of Onset  . Heart disease Mother   . Hyperlipidemia Father   . Heart attack Father   . Diabetes Sister   . Breast cancer Paternal Aunt   . Breast cancer Cousin        paternal    Allergies  Allergen Reactions  . Ace Inhibitors Cough    Current Outpatient Medications on File Prior to Visit  Medication Sig Dispense Refill  . amLODipine (NORVASC) 10 MG tablet Take 1 tablet by mouth once daily for blood pressure. 90 tablet 3  . diclofenac sodium  (VOLTAREN) 1 % GEL APPLY 2 GRAMS TOPICALLY 2 (TWO) TIMES DAILY. 100 g 1  . fluticasone (FLONASE) 50 MCG/ACT nasal spray Place 1 spray into both nostrils 2 (two) times daily. 16 g 3   No current facility-administered medications on file prior to visit.     BP 118/68   Pulse 64   Temp 97.7 F (36.5 C) (Oral)   Ht 5' 4.5" (1.638 m)   Wt 242 lb (109.8 kg)   LMP 04/17/2018   SpO2 98%   BMI 40.90 kg/m    Objective:   Physical Exam  Constitutional: She is oriented to person, place, and time. She appears well-nourished.  HENT:  Mouth/Throat: No oropharyngeal exudate.  Eyes: Pupils are equal, round, and reactive to light. EOM are normal.  Neck: Neck supple. No thyromegaly present.  Cardiovascular: Normal rate and regular rhythm.  Respiratory: Effort normal and breath sounds normal.  GI: Soft. Bowel sounds are normal. There is no tenderness.  Musculoskeletal: Normal range of motion.  Neurological: She is alert and oriented to person, place, and time.  Skin: Skin is warm and dry.  Psychiatric: She has a normal mood and affect.           Assessment & Plan:

## 2018-04-25 NOTE — Assessment & Plan Note (Signed)
Using diclofenac sparingly as needed. Recently started exercising at the gym, commended her on this. Encouraged weight loss.

## 2018-04-25 NOTE — Patient Instructions (Addendum)
Complete your mammogram as discussed.  Stop by the lab prior to leaving today. I will notify you of your results once received.   Continue exercising. You should be getting 150 minutes of moderate intensity exercise weekly.  Continue to work on [employer], don't give up!  Follow up in 1 year for your annual exam or sooner if needed.  It was a pleasure to see you today!   Preventive Care 40-64 Years, Female Preventive care refers to lifestyle choices and visits with your health care provider that can promote health and wellness. What does preventive care include?  A yearly physical exam. This is also called an annual well check.  Dental exams once or twice a year.  Routine eye exams. Ask your health care provider how often you should have your eyes checked.  Personal lifestyle choices, including: ? Daily care of your teeth and gums. ? Regular physical activity. ? Eating a healthy diet. ? Avoiding tobacco and drug use. ? Limiting alcohol use. ? Practicing safe sex. ? Taking low-dose aspirin daily starting at age 25. ? Taking vitamin and mineral supplements as recommended by your health care provider. What happens during an annual well check? The services and screenings done by your health care provider during your annual well check will depend on your age, overall health, lifestyle risk factors, and family history of disease. Counseling Your health care provider may ask you questions about your:  Alcohol use.  Tobacco use.  Drug use.  Emotional well-being.  Home and relationship well-being.  Sexual activity.  Eating habits.  Work and work Statistician.  Method of birth control.  Menstrual cycle.  Pregnancy history.  Screening You may have the following tests or measurements:  Height, weight, and BMI.  Blood pressure.  Lipid and cholesterol levels. These may be checked every 5 years, or more frequently if you are over 27 years old.  Skin check.  Lung  cancer screening. You may have this screening every year starting at age 27 if you have a 30-pack-year history of smoking and currently smoke or have quit within the past 15 years.  Fecal occult blood test (FOBT) of the stool. You may have this test every year starting at age 16.  Flexible sigmoidoscopy or colonoscopy. You may have a sigmoidoscopy every 5 years or a colonoscopy every 10 years starting at age 65.  Hepatitis C blood test.  Hepatitis B blood test.  Sexually transmitted disease (STD) testing.  Diabetes screening. This is done by checking your blood sugar (glucose) after you have not eaten for a while (fasting). You may have this done every 1-3 years.  Mammogram. This may be done every 1-2 years. Talk to your health care provider about when you should start having regular mammograms. This may depend on whether you have a family history of breast cancer.  BRCA-related cancer screening. This may be done if you have a family history of breast, ovarian, tubal, or peritoneal cancers.  Pelvic exam and Pap test. This may be done every 3 years starting at age 11. Starting at age 46, this may be done every 5 years if you have a Pap test in combination with an HPV test.  Bone density scan. This is done to screen for osteoporosis. You may have this scan if you are at high risk for osteoporosis.  Discuss your test results, treatment options, and if necessary, the need for more tests with your health care provider. Vaccines Your health care provider may recommend certain  vaccines, such as:  Influenza vaccine. This is recommended every year.  Tetanus, diphtheria, and acellular pertussis (Tdap, Td) vaccine. You may need a Td booster every 10 years.  Varicella vaccine. You may need this if you have not been vaccinated.  Zoster vaccine. You may need this after age 65.  Measles, mumps, and rubella (MMR) vaccine. You may need at least one dose of MMR if you were born in 1957 or later. You  may also need a second dose.  Pneumococcal 13-valent conjugate (PCV13) vaccine. You may need this if you have certain conditions and were not previously vaccinated.  Pneumococcal polysaccharide (PPSV23) vaccine. You may need one or two doses if you smoke cigarettes or if you have certain conditions.  Meningococcal vaccine. You may need this if you have certain conditions.  Hepatitis A vaccine. You may need this if you have certain conditions or if you travel or work in places where you may be exposed to hepatitis A.  Hepatitis B vaccine. You may need this if you have certain conditions or if you travel or work in places where you may be exposed to hepatitis B.  Haemophilus influenzae type b (Hib) vaccine. You may need this if you have certain conditions.  Talk to your health care provider about which screenings and vaccines you need and how often you need them. This information is not intended to replace advice given to you by your health care provider. Make sure you discuss any questions you have with your health care provider. Document Released: 10/08/2015 Document Revised: 05/31/2016 Document Reviewed: 07/13/2015 Elsevier Interactive Patient Education  [employer].

## 2018-04-29 ENCOUNTER — Ambulatory Visit (INDEPENDENT_AMBULATORY_CARE_PROVIDER_SITE_OTHER): Payer: BLUE CROSS/BLUE SHIELD | Admitting: *Deleted

## 2018-04-29 DIAGNOSIS — Z111 Encounter for screening for respiratory tuberculosis: Secondary | ICD-10-CM

## 2018-04-29 NOTE — Progress Notes (Signed)
Per orders of Webb Silversmith (for Allie Bossier), injection of TB skin test given by [facility]. Patient tolerated injection well.

## 2018-04-30 ENCOUNTER — Other Ambulatory Visit: Payer: Self-pay | Admitting: Primary Care

## 2018-04-30 DIAGNOSIS — J302 Other seasonal allergic rhinitis: Secondary | ICD-10-CM

## 2018-05-01 LAB — TB SKIN TEST
Induration: 0 mm
TB SKIN TEST: NEGATIVE

## 2018-05-03 ENCOUNTER — Telehealth: Payer: Self-pay | Admitting: Primary Care

## 2018-05-03 ENCOUNTER — Ambulatory Visit
Admission: RE | Admit: 2018-05-03 | Discharge: 2018-05-03 | Disposition: A | Discharge status: Home / Self Care | Payer: BLUE CROSS/BLUE SHIELD | Source: Ambulatory Visit | Attending: Primary Care | Admitting: Primary Care

## 2018-05-03 DIAGNOSIS — Z1231 Encounter for screening mammogram for malignant neoplasm of breast: Secondary | ICD-10-CM

## 2018-05-03 NOTE — Telephone Encounter (Signed)
Place form/paperwork in [name]'s inbox for review and complete if necessary.  

## 2018-05-03 NOTE — Telephone Encounter (Signed)
Completed and placed in Chan's inbox. Looks like she needs a TB skin test.

## 2018-05-03 NOTE — Telephone Encounter (Signed)
Pt dropped off physical form to be filled out. Placed in RX tower. °

## 2018-05-06 NOTE — Telephone Encounter (Signed)
Per DPR, left detail message for patient that form is ready for pick up. Left in the front office.  TB skin test was done 04/29/2018

## 2018-07-10 ENCOUNTER — Other Ambulatory Visit: Payer: Self-pay | Admitting: Primary Care

## 2018-07-10 DIAGNOSIS — E559 Vitamin D deficiency, unspecified: Secondary | ICD-10-CM

## 2018-07-19 ENCOUNTER — Other Ambulatory Visit: Payer: BC Managed Care – PPO

## 2018-07-23 ENCOUNTER — Other Ambulatory Visit: Payer: Self-pay | Admitting: Primary Care

## 2018-07-23 DIAGNOSIS — J302 Other seasonal allergic rhinitis: Secondary | ICD-10-CM

## 2018-08-15 ENCOUNTER — Other Ambulatory Visit: Payer: Self-pay | Admitting: Primary Care

## 2018-08-16 ENCOUNTER — Other Ambulatory Visit: Payer: BC Managed Care – PPO

## 2018-08-28 ENCOUNTER — Other Ambulatory Visit (INDEPENDENT_AMBULATORY_CARE_PROVIDER_SITE_OTHER): Payer: BC Managed Care – PPO

## 2018-08-28 DIAGNOSIS — E559 Vitamin D deficiency, unspecified: Secondary | ICD-10-CM

## 2018-08-29 LAB — VITAMIN D 25 HYDROXY (VIT D DEFICIENCY, FRACTURES): VITD: 30.84 ng/mL (ref 30.00–100.00)

## 2018-09-26 ENCOUNTER — Other Ambulatory Visit: Payer: Self-pay | Admitting: Primary Care

## 2018-09-26 DIAGNOSIS — M170 Bilateral primary osteoarthritis of knee: Secondary | ICD-10-CM

## 2018-09-26 NOTE — Telephone Encounter (Signed)
Last prescribed on 10/26/2017 100g  with 1 refills. Last office visit on 04/25/2018. Next future lab appointment on 10/29/2018.

## 2018-09-26 NOTE — Telephone Encounter (Signed)
Noted.  Refill sent to pharmacy. 

## 2018-10-09 ENCOUNTER — Telehealth: Payer: Self-pay

## 2018-10-09 NOTE — Telephone Encounter (Signed)
Your information has been submitted to [employer]. To check for an updated outcome later, reopen this PA request from your dashboard. If [employer] has not responded to your request within 24 hours, contact [employer] at [phone]. If you think there may be a problem with your PA request, use our live chat feature at the bottom right.    

## 2018-10-10 NOTE — Telephone Encounter (Signed)
Medication approved to 10-09-21.Marland Kitchenas long as she keeps the plan she has right now.

## 2018-10-14 ENCOUNTER — Other Ambulatory Visit: Payer: Self-pay | Admitting: Primary Care

## 2018-10-14 DIAGNOSIS — J302 Other seasonal allergic rhinitis: Secondary | ICD-10-CM

## 2018-10-17 ENCOUNTER — Ambulatory Visit: Payer: BC Managed Care – PPO | Admitting: Primary Care

## 2018-10-17 ENCOUNTER — Encounter: Payer: Self-pay | Admitting: Primary Care

## 2018-10-17 VITALS — BP 118/70 | HR 90 | Temp 98.6°F | Ht 64.5 in | Wt 243.8 lb

## 2018-10-17 DIAGNOSIS — R05 Cough: Secondary | ICD-10-CM

## 2018-10-17 DIAGNOSIS — E559 Vitamin D deficiency, unspecified: Secondary | ICD-10-CM

## 2018-10-17 DIAGNOSIS — R7303 Prediabetes: Secondary | ICD-10-CM

## 2018-10-17 DIAGNOSIS — R053 Chronic cough: Secondary | ICD-10-CM

## 2018-10-17 DIAGNOSIS — J302 Other seasonal allergic rhinitis: Secondary | ICD-10-CM | POA: Diagnosis not present

## 2018-10-17 MED ORDER — GUAIFENESIN-CODEINE 100-10 MG/5ML PO SYRP: 5.0000 mL | ORAL_SOLUTION | Freq: Three times a day (TID) | ORAL | 0 refills | Status: DC | PRN

## 2018-10-17 MED ORDER — ALBUTEROL SULFATE HFA 108 (90 BASE) MCG/ACT IN AERS: 2.0000 | INHALATION_SPRAY | RESPIRATORY_TRACT | 0 refills | Status: DC | PRN

## 2018-10-17 NOTE — Assessment & Plan Note (Signed)
Repeat A1C in 2 months.

## 2018-10-17 NOTE — Assessment & Plan Note (Signed)
She is now taking 5000 units daily, repeat vitamin D in 2 months.

## 2018-10-17 NOTE — Progress Notes (Signed)
Subjective:    Patient ID: Rachel Swanson, female    DOB: 07-Dec-1975, 43 y.o.   MRN: [id number]  HPI  Rachel Swanson is a 43 year old female with a history of hypertension, seasonal allergies who presents today with a chief complaint of cough.   She also reports chest congestion. Her symptoms began 2-3 weeks She's taken Delsym, Robitussin, cough drops, hot tea without improvement. She is compliant to her Singulair and Flonase, this has helped with allergies.   She has a history of this type of cough that occurred annually when living in [place]. She did visit her mother in [place] over the holidays. This cough feels similar. She feels well overall and denies fevers, fatigue, weakness.   Review of Systems  Constitutional: Negative for chills, fatigue and fever.  HENT: Positive for congestion. Negative for ear pain and sore throat.   Respiratory: Positive for cough. Negative for shortness of breath and wheezing.   Allergic/Immunologic: Positive for environmental allergies.       Past Medical History:  Diagnosis Date  . Hypertension   . Seasonal allergies      Social History   Socioeconomic History  . Marital status: Single    Spouse name: Not on file  . Number of children: Not on file  . Years of education: Not on file  . Highest education level: Not on file  Occupational History  . Not on file  Social Needs  . Financial resource strain: Not on file  . Food insecurity:    Worry: Not on file    Inability: Not on file  . Transportation needs:    Medical: Not on file    Non-medical: Not on file  Tobacco Use  . Smoking status: Never Smoker  . Smokeless tobacco: Never Used  Substance and Sexual Activity  . Alcohol use: No  . Drug use: No  . Sexual activity: Not Currently    Partners: Male    Birth control/protection: Condom  Lifestyle  . Physical activity:    Days per week: Not on file    Minutes per session: Not on file  . Stress: Not on file    Relationships  . Social connections:    Talks on phone: Not on file    Gets together: Not on file    Attends religious service: Not on file    Active member of club or organization: Not on file    Attends meetings of clubs or organizations: Not on file    Relationship status: Not on file  . Intimate partner violence:    Fear of current or ex partner: Not on file    Emotionally abused: Not on file    Physically abused: Not on file    Forced sexual activity: Not on file  Other Topics Concern  . 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     Past Surgical History:  Procedure Laterality Date  . BUNIONECTOMY Right   . WISDOM TOOTH EXTRACTION      Family History  Problem Relation Age of Onset  . Heart disease Mother   . Hyperlipidemia Father   . Heart attack Father   . Diabetes Sister   . Breast cancer Paternal Aunt   . Breast cancer Cousin        paternal    Allergies  Allergen Reactions  . Ace Inhibitors Cough  Current Outpatient Medications on File Prior to Visit  Medication Sig Dispense Refill  . amLODipine (NORVASC) 10 MG tablet Take 1 tablet by mouth once daily for blood pressure. 90 tablet 3  . diclofenac sodium (VOLTAREN) 1 % GEL Apply 2 g topically 2 (two) times daily as needed. 100 g 0  . fluticasone (FLONASE) 50 MCG/ACT nasal spray PLACE 1 SPRAY INTO BOTH NOSTRILS 2 (TWO) TIMES DAILY 48 g 1  . hydrochlorothiazide (HYDRODIURIL) 25 MG tablet Take 1 tablet (25 mg total) by mouth daily. 90 tablet 3  . montelukast (SINGULAIR) 10 MG tablet Take 1 tablet (10 mg total) by mouth at bedtime. 90 tablet 1   No current facility-administered medications on file prior to visit.     BP 118/70   Pulse 90   Temp 98.6 F (37 C) (Oral)   Ht 5' 4.5" (1.638 m)   Wt 243 lb 12 oz (110.6 kg)   LMP 09/08/2018   SpO2 98%   BMI 41.19 kg/m    Objective:   Physical Exam  Constitutional: She  appears well-nourished. She does not appear ill.  HENT:  Right Ear: Tympanic membrane and ear canal normal.  Left Ear: Tympanic membrane and ear canal normal.  Nose: No mucosal edema. Right sinus exhibits no maxillary sinus tenderness and no frontal sinus tenderness. Left sinus exhibits no maxillary sinus tenderness and no frontal sinus tenderness.  Mouth/Throat: Oropharynx is clear and moist.  Neck: Neck supple.  Cardiovascular: Normal rate and regular rhythm.  Respiratory: Effort normal and breath sounds normal. She has no wheezes.  Persistent dry cough during exam  Skin: Skin is warm and dry.           Assessment & Plan:

## 2018-10-17 NOTE — Assessment & Plan Note (Signed)
Persistent cough over the last 2-3 weeks, overall feels well.  Exam today without evidence for bacterial involvement or influenza. Suspect more allergy flare given recent travel and weather changes. Continue Singulair and Flonase.  Rx for albuterol and Cheratussin provided. She will update in one week if no improvement. Does not appear ill/sickly.

## 2018-10-17 NOTE — Patient Instructions (Signed)
Shortness of Breath/Wheezing/Cough: Use the albuterol inhaler. Inhale 2 puffs into the lungs every 4 to 6 hours as needed for wheezing, cough, and/or shortness of breath.   You may take the cough suppressant every 8 hours as needed for cough and rest. Caution this medication contains codeine which may cause drowsiness.   Please update me in one week if you don't notice improvement in your cough.  Schedule a lab only appointment for 2 months so we can repeat your vitamin D and blood sugar test.  It was a pleasure to see you today!

## 2018-10-28 ENCOUNTER — Other Ambulatory Visit: Payer: Self-pay | Admitting: Primary Care

## 2018-10-28 DIAGNOSIS — M170 Bilateral primary osteoarthritis of knee: Secondary | ICD-10-CM

## 2018-10-29 ENCOUNTER — Other Ambulatory Visit: Payer: BC Managed Care – PPO

## 2018-11-03 ENCOUNTER — Other Ambulatory Visit: Payer: Self-pay | Admitting: Primary Care

## 2018-11-03 DIAGNOSIS — J3089 Other allergic rhinitis: Secondary | ICD-10-CM

## 2018-11-27 ENCOUNTER — Other Ambulatory Visit: Payer: BC Managed Care – PPO

## 2018-12-18 ENCOUNTER — Other Ambulatory Visit: Payer: BC Managed Care – PPO

## 2019-01-08 ENCOUNTER — Encounter: Payer: Self-pay | Admitting: Primary Care

## 2019-01-08 ENCOUNTER — Ambulatory Visit (INDEPENDENT_AMBULATORY_CARE_PROVIDER_SITE_OTHER): Payer: BC Managed Care – PPO | Admitting: Primary Care

## 2019-01-08 DIAGNOSIS — R05 Cough: Secondary | ICD-10-CM

## 2019-01-08 DIAGNOSIS — R053 Chronic cough: Secondary | ICD-10-CM

## 2019-01-08 DIAGNOSIS — J302 Other seasonal allergic rhinitis: Secondary | ICD-10-CM

## 2019-01-08 MED ORDER — GUAIFENESIN-CODEINE 100-10 MG/5ML PO SYRP: 5.0000 mL | ORAL_SOLUTION | Freq: Three times a day (TID) | ORAL | 0 refills | Status: DC | PRN

## 2019-01-08 NOTE — Patient Instructions (Signed)
Continue Singulair and Flonase for allergies.  Start Xyzal, Allegra, Zyrtec, or Claritin for allergies. Take at the opposite time of day from your Singulair.  You may take the cough suppressant every 8 hours as needed for cough and rest. Caution this medication contains codeine which may cause drowsiness.   It was a pleasure to see you today! Allie Bossier, NP-C

## 2019-01-08 NOTE — Assessment & Plan Note (Signed)
Symptoms clearly represent allergy involvement. No fevers, no s/s of Covid-19 infection.  Discussed to continue Singulair and Flonase, add on antihistamine for which she will purchase OTC. Will provide her with a short term supply of Robitussin AC per her request. Follow up PRN.

## 2019-01-08 NOTE — Progress Notes (Signed)
Subjective:    Patient ID: Rachel Swanson, female    DOB: March 01, 1976, 43 y.o.   MRN: [id number]  HPI  Virtual Visit via Video Note  I connected with Rachel Swanson on 01/08/19 at  9:20 AM EDT by a video enabled telemedicine application and verified that I am speaking with the correct person using two identifiers.   I discussed the limitations of evaluation and management by telemedicine and the availability of in person appointments. The patient expressed understanding and agreed to proceed. She is at home, I am in the office.  History of Present Illness:  Rachel Swanson is a 43 year old female with a history of hypertension, seasonal allergies, GERD who presents today via video with a chief complaint of cough.  She believes that his allergies are "flaring up". She endorses a dry cough mostly, post nasal drip, sneezing. She denies fevers, body aches, itchy/watery eyes. She is taking her Singulair, Flonase with some improvement. She is just wanting something to help with cough. She's tried taking Elderberry cough syrup without much improvement.    Observations/Objective:  Alert and oriented. Appears well, not sickly. Dry cough infrequently present during visit.  Assessment and Plan:  Symptoms clearly represent allergy involvement. No fevers, no s/s of Covid-19 infection.  Discussed to continue Singulair and Flonase, add on antihistamine for which she will purchase OTC. Will provide her with a short term supply of Robitussin AC per her request. Follow up PRN.  Follow Up Instructions:  Continue Singulair and Flonase for allergies.  Start Xyzal, Allegra, Zyrtec, or Claritin for allergies. Take at the opposite time of day from your Singulair.  You may take the cough suppressant every 8 hours as needed for cough and rest. Caution this medication contains codeine which may cause drowsiness.   It was a pleasure to see you today! Rachel Bossier, NP-C    I discussed the  assessment and treatment plan with the patient. The patient was provided an opportunity to ask questions and all were answered. The patient agreed with the plan and demonstrated an understanding of the instructions.   The patient was advised to call back or seek an in-person evaluation if the symptoms worsen or if the condition fails to improve as anticipated.     Rachel Koch, NP    Review of Systems  Constitutional: Negative for chills, fatigue and fever.  HENT: Positive for congestion, postnasal drip and rhinorrhea. Negative for ear pain, sinus pressure and sore throat.   Respiratory: Positive for cough. Negative for shortness of breath and wheezing.   Allergic/Immunologic: Positive for environmental allergies.       Past Medical History:  Diagnosis Date  . Hypertension   . Seasonal allergies      Social History   Socioeconomic History  . Marital status: Single    Spouse name: Not on file  . Number of children: Not on file  . Years of education: Not on file  . Highest education level: Not on file  Occupational History  . Not on file  Social Needs  . Financial resource strain: Not on file  . Food insecurity:    Worry: Not on file    Inability: Not on file  . Transportation needs:    Medical: Not on file    Non-medical: Not on file  Tobacco Use  . Smoking status: Never Smoker  . Smokeless tobacco: Never Used  Substance and Sexual Activity  . Alcohol use: No  . Drug use: No  .  Sexual activity: Not Currently    Partners: Male    Birth control/protection: Condom  Lifestyle  . Physical activity:    Days per week: Not on file    Minutes per session: Not on file  . Stress: Not on file  Relationships  . Social connections:    Talks on phone: Not on file    Gets together: Not on file    Attends religious service: Not on file    Active member of club or organization: Not on file    Attends meetings of clubs or organizations: Not on file    Relationship  status: Not on file  . Intimate partner violence:    Fear of current or ex partner: Not on file    Emotionally abused: Not on file    Physically abused: Not on file    Forced sexual activity: Not on file  Other Topics Concern  . 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     Past Surgical History:  Procedure Laterality Date  . BUNIONECTOMY Right   . WISDOM TOOTH EXTRACTION      Family History  Problem Relation Age of Onset  . Heart disease Mother   . Hyperlipidemia Father   . Heart attack Father   . Diabetes Sister   . Breast cancer Paternal Aunt   . Breast cancer Cousin        paternal    Allergies  Allergen Reactions  . Ace Inhibitors Cough    Current Outpatient Medications on File Prior to Visit  Medication Sig Dispense Refill  . albuterol (PROVENTIL HFA;VENTOLIN HFA) 108 (90 Base) MCG/ACT inhaler Inhale 2 puffs into the lungs every 4 (four) hours as needed for shortness of breath. 1 Inhaler 0  . amLODipine (NORVASC) 10 MG tablet Take 1 tablet by mouth once daily for blood pressure. 90 tablet 3  . diclofenac sodium (VOLTAREN) 1 % GEL Apply 2 g topically 2 (two) times daily as needed. 100 g 0  . fluticasone (FLONASE) 50 MCG/ACT nasal spray PLACE 1 SPRAY INTO BOTH NOSTRILS 2 (TWO) TIMES DAILY 48 g 1  . guaiFENesin-codeine (ROBITUSSIN AC) 100-10 MG/5ML syrup Take 5 mLs by mouth 3 (three) times daily as needed for cough. 75 mL 0  . hydrochlorothiazide (HYDRODIURIL) 25 MG tablet Take 1 tablet (25 mg total) by mouth daily. 90 tablet 3  . montelukast (SINGULAIR) 10 MG tablet TAKE 1 TABLET BY MOUTH EVERYDAY AT BEDTIME 90 tablet 1   No current facility-administered medications on file prior to visit.     There were no vitals taken for this visit.   Objective:   Physical Exam  Constitutional: She is oriented to person, place, and time. She appears well-nourished.  Respiratory: Effort normal. No  respiratory distress.  Infrequent dry cough during exam  Neurological: She is alert and oriented to person, place, and time.  Psychiatric: She has a normal mood and affect.           Assessment & Plan:

## 2019-01-19 DIAGNOSIS — M170 Bilateral primary osteoarthritis of knee: Secondary | ICD-10-CM

## 2019-01-20 MED ORDER — DICLOFENAC SODIUM 1 % TD GEL: 2.0000 g | Freq: Two times a day (BID) | TRANSDERMAL | 0 refills | Status: DC | PRN

## 2019-01-31 ENCOUNTER — Other Ambulatory Visit: Payer: Self-pay | Admitting: Primary Care

## 2019-01-31 DIAGNOSIS — I10 Essential (primary) hypertension: Secondary | ICD-10-CM

## 2019-03-13 ENCOUNTER — Encounter: Payer: Self-pay | Admitting: Obstetrics & Gynecology

## 2019-03-13 ENCOUNTER — Other Ambulatory Visit: Payer: Self-pay

## 2019-03-13 ENCOUNTER — Ambulatory Visit (INDEPENDENT_AMBULATORY_CARE_PROVIDER_SITE_OTHER): Payer: BC Managed Care – PPO | Admitting: Obstetrics & Gynecology

## 2019-03-13 VITALS — BP 140/86 | HR 82 | Ht 65.0 in | Wt 248.0 lb

## 2019-03-13 DIAGNOSIS — Z3009 Encounter for other general counseling and advice on contraception: Secondary | ICD-10-CM

## 2019-03-13 DIAGNOSIS — Z1151 Encounter for screening for human papillomavirus (HPV): Secondary | ICD-10-CM

## 2019-03-13 DIAGNOSIS — Z01419 Encounter for gynecological examination (general) (routine) without abnormal findings: Secondary | ICD-10-CM | POA: Diagnosis not present

## 2019-03-13 DIAGNOSIS — Z124 Encounter for screening for malignant neoplasm of cervix: Secondary | ICD-10-CM

## 2019-03-13 DIAGNOSIS — Z1231 Encounter for screening mammogram for malignant neoplasm of breast: Secondary | ICD-10-CM

## 2019-03-13 NOTE — Progress Notes (Signed)
GYNECOLOGY ANNUAL PREVENTATIVE CARE ENCOUNTER NOTE  History:     Rachel Swanson is a 43 y.o. G23P0010 female here for a routine annual gynecologic exam.  Current complaints: none.  Desires to discuss contraception. Not sexually active now, last time was six months ago but wants to know her options. Has used condoms in past. Wants to avoid high hormonal modalities.  Denies abnormal vaginal bleeding, discharge, pelvic pain, problems with intercourse or other gynecologic concerns.    Gynecologic History Patient's last menstrual period was 03/03/2019 (exact date). Contraception: condoms Last Pap: 2018. Results were: normal with negative HPV Last mammogram: 04/2018. Results were: normal  Obstetric History OB History  Gravida Para Term Preterm AB Living  1       1    SAB TAB Ectopic Multiple Live Births  1            # Outcome Date GA Lbr Len/2nd Weight Sex Delivery Anes PTL Lv  1 SAB             Past Medical History:  Diagnosis Date  . Hypertension   . Seasonal allergies     Past Surgical History:  Procedure Laterality Date  . BUNIONECTOMY Right   . WISDOM TOOTH EXTRACTION      Current Outpatient Medications on File Prior to Visit  Medication Sig Dispense Refill  . albuterol (PROVENTIL HFA;VENTOLIN HFA) 108 (90 Base) MCG/ACT inhaler Inhale 2 puffs into the lungs every 4 (four) hours as needed for shortness of breath. 1 Inhaler 0  . amLODipine (NORVASC) 10 MG tablet TAKE 1 TABLET BY MOUTH ONCE DAILY FOR BLOOD PRESSURE. 90 tablet 1  . diclofenac sodium (VOLTAREN) 1 % GEL Apply 2 g topically 2 (two) times daily as needed. 100 g 0  . fluticasone (FLONASE) 50 MCG/ACT nasal spray PLACE 1 SPRAY INTO BOTH NOSTRILS 2 (TWO) TIMES DAILY 48 g 1  . guaiFENesin-codeine (ROBITUSSIN AC) 100-10 MG/5ML syrup Take 5 mLs by mouth 3 (three) times daily as needed for cough. 75 mL 0  . hydrochlorothiazide (HYDRODIURIL) 25 MG tablet Take 1 tablet (25 mg total) by mouth daily. 90 tablet 3  .  montelukast (SINGULAIR) 10 MG tablet TAKE 1 TABLET BY MOUTH EVERYDAY AT BEDTIME 90 tablet 1   No current facility-administered medications on file prior to visit.     Allergies  Allergen Reactions  . Ace Inhibitors Cough    Social History:  reports that she has never smoked. She has never used smokeless tobacco. She reports that she does not drink alcohol or use drugs.  Family History  Problem Relation Age of Onset  . Heart disease Mother   . Hyperlipidemia Father   . Heart attack Father   . Diabetes Sister   . Breast cancer Paternal Aunt   . Breast cancer Cousin        paternal    The following portions of the patient's history were reviewed and updated as appropriate: allergies, current medications, past family history, past medical history, past social history, past surgical history and problem list.  Review of Systems Pertinent items noted in HPI and remainder of comprehensive ROS otherwise negative.  Physical Exam:  BP 140/86   Pulse 82   Ht 5\' 5"  (1.651 m)   Wt 248 lb (112.5 kg)   LMP 03/03/2019 (Exact Date)   BMI 41.27 kg/m  CONSTITUTIONAL: Well-developed, well-nourished female in no acute distress.  HENT:  Normocephalic, atraumatic, External right and left ear normal. Oropharynx is clear and  moist EYES: Conjunctivae and EOM are normal. Pupils are equal, round, and reactive to light. No scleral icterus.  NECK: Normal range of motion, supple, no masses.  Normal thyroid.  SKIN: Skin is warm and dry. No rash noted. Not diaphoretic. No erythema. No pallor. MUSCULOSKELETAL: Normal range of motion. No tenderness.  No cyanosis, clubbing, or edema.  2+ distal pulses. NEUROLOGIC: Alert and oriented to person, place, and time. Normal reflexes, muscle tone coordination. No cranial nerve deficit noted. PSYCHIATRIC: Normal mood and affect. Normal behavior. Normal judgment and thought content. CARDIOVASCULAR: Normal heart rate noted, regular rhythm RESPIRATORY: Clear to  auscultation bilaterally. Effort and breath sounds normal, no problems with respiration noted. BREASTS: Symmetric in size. No masses, skin changes, nipple drainage, or lymphadenopathy. ABDOMEN: Soft, normal bowel sounds, no distention noted.  No tenderness, rebound or guarding.  PELVIC: Normal appearing external genitalia; normal appearing vaginal mucosa and cervix.  No abnormal discharge noted.  Pap smear obtained.  Normal uterine size, no other palpable masses, no uterine or adnexal tenderness.   Assessment and Plan:    1. Encounter for counseling regarding contraception Patient interested in low hormone or non-hormonal methods. Discussed condoms which are also recommended for STI prevention.  Also talked about fertility awareness methods which could complement condoms.  Also discussed progestin only methods given her HTN (pills, shot, implant, IUD) and nonhormonal copper IUD. All questions answered. She will use condoms and fertility awareness for now, information given for the other modalities. ,   2. Encounter for screening mammogram for breast cancer Mammogram ordered - MM 3D SCREEN BREAST BILATERAL; Future  3. Well woman exam with routine gynecological exam - Cytology - PAP done Will follow up results of pap smear and manage accordingly. Routine preventative health maintenance measures emphasized. Please refer to After Visit Summary for other counseling recommendations.      Verita Schneiders, MD, [facility], [place]

## 2019-03-13 NOTE — Patient Instructions (Addendum)
We talked about using [employer] ovulation tracker, an app Progestin only pill, IUD (information given to you) Use condoms at all times to prevent STDs  Preventive Care 40-64 Years, Female Preventive care refers to lifestyle choices and visits with your health care provider that can promote health and wellness. What does preventive care include?   A yearly physical exam. This is also called an annual well check.  Dental exams once or twice a year.  Routine eye exams. Ask your health care provider how often you should have your eyes checked.  Personal lifestyle choices, including: ? Daily care of your teeth and gums. ? Regular physical activity. ? Eating a healthy diet. ? Avoiding tobacco and drug use. ? Limiting alcohol use. ? Practicing safe sex. ? Taking low-dose aspirin daily starting at age 82. ? Taking vitamin and mineral supplements as recommended by your health care provider. What happens during an annual well check? The services and screenings done by your health care provider during your annual well check will depend on your age, overall health, lifestyle risk factors, and family history of disease. Counseling Your health care provider may ask you questions about your:  Alcohol use.  Tobacco use.  Drug use.  Emotional well-being.  Home and relationship well-being.  Sexual activity.  Eating habits.  Work and work Statistician.  Method of birth control.  Menstrual cycle.  Pregnancy history. Screening You may have the following tests or measurements:  Height, weight, and BMI.  Blood pressure.  Lipid and cholesterol levels. These may be checked every 5 years, or more frequently if you are over 20 years old.  Skin check.  Lung cancer screening. You may have this screening every year starting at age 28 if you have a 30-pack-year history of smoking and currently smoke or have quit within the past 15 years.  Colorectal cancer screening. All adults should have  this screening starting at age 69 and continuing until age 45. Your health care provider may recommend screening at age 11. You will have tests every 1-10 years, depending on your results and the type of screening test. People at increased risk should start screening at an earlier age. Screening tests may include: ? Guaiac-based fecal occult blood testing. ? Fecal immunochemical test (FIT). ? Stool DNA test. ? Virtual colonoscopy. ? Sigmoidoscopy. During this test, a flexible tube with a tiny camera (sigmoidoscope) is used to examine your rectum and lower colon. The sigmoidoscope is inserted through your anus into your rectum and lower colon. ? Colonoscopy. During this test, a long, thin, flexible tube with a tiny camera (colonoscope) is used to examine your entire colon and rectum.  Hepatitis C blood test.  Hepatitis B blood test.  Sexually transmitted disease (STD) testing.  Diabetes screening. This is done by checking your blood sugar (glucose) after you have not eaten for a while (fasting). You may have this done every 1-3 years.  Mammogram. This may be done every 1-2 years. Talk to your health care provider about when you should start having regular mammograms. This may depend on whether you have a family history of breast cancer.  BRCA-related cancer screening. This may be done if you have a family history of breast, ovarian, tubal, or peritoneal cancers.  Pelvic exam and Pap test. This may be done every 3 years starting at age 88. Starting at age 43, this may be done every 5 years if you have a Pap test in combination with an HPV test.  Bone density scan.  This is done to screen for osteoporosis. You may have this scan if you are at high risk for osteoporosis. Discuss your test results, treatment options, and if necessary, the need for more tests with your health care provider. Vaccines Your health care provider may recommend certain vaccines, such as:  Influenza vaccine. This is  recommended every year.  Tetanus, diphtheria, and acellular pertussis (Tdap, Td) vaccine. You may need a Td booster every 10 years.  Varicella vaccine. You may need this if you have not been vaccinated.  Zoster vaccine. You may need this after age 64.  Measles, mumps, and rubella (MMR) vaccine. You may need at least one dose of MMR if you were born in 1957 or later. You may also need a second dose.  Pneumococcal 13-valent conjugate (PCV13) vaccine. You may need this if you have certain conditions and were not previously vaccinated.  Pneumococcal polysaccharide (PPSV23) vaccine. You may need one or two doses if you smoke cigarettes or if you have certain conditions.  Meningococcal vaccine. You may need this if you have certain conditions.  Hepatitis A vaccine. You may need this if you have certain conditions or if you travel or work in places where you may be exposed to hepatitis A.  Hepatitis B vaccine. You may need this if you have certain conditions or if you travel or work in places where you may be exposed to hepatitis B.  Haemophilus influenzae type b (Hib) vaccine. You may need this if you have certain conditions. Talk to your health care provider about which screenings and vaccines you need and how often you need them. This information is not intended to replace advice given to you by your health care provider. Make sure you discuss any questions you have with your health care provider. Document Released: 10/08/2015 Document Revised: 11/01/2017 Document Reviewed: 07/13/2015 Elsevier Interactive Patient Education  2019 [employer].

## 2019-03-18 LAB — CYTOLOGY - PAP
Diagnosis: NEGATIVE
HPV: NOT DETECTED

## 2019-04-05 ENCOUNTER — Other Ambulatory Visit: Payer: Self-pay | Admitting: Primary Care

## 2019-04-05 DIAGNOSIS — J302 Other seasonal allergic rhinitis: Secondary | ICD-10-CM

## 2019-05-01 ENCOUNTER — Other Ambulatory Visit: Payer: Self-pay | Admitting: Primary Care

## 2019-05-01 DIAGNOSIS — I10 Essential (primary) hypertension: Secondary | ICD-10-CM

## 2019-05-04 ENCOUNTER — Other Ambulatory Visit: Payer: Self-pay | Admitting: Primary Care

## 2019-05-04 DIAGNOSIS — I10 Essential (primary) hypertension: Secondary | ICD-10-CM

## 2019-05-04 DIAGNOSIS — J3089 Other allergic rhinitis: Secondary | ICD-10-CM

## 2019-05-05 ENCOUNTER — Other Ambulatory Visit: Payer: Self-pay

## 2019-05-05 ENCOUNTER — Ambulatory Visit
Admission: RE | Admit: 2019-05-05 | Discharge: 2019-05-05 | Disposition: A | Discharge status: Home / Self Care | Payer: BC Managed Care – PPO | Source: Ambulatory Visit | Attending: Obstetrics & Gynecology | Admitting: Obstetrics & Gynecology

## 2019-05-05 DIAGNOSIS — Z1231 Encounter for screening mammogram for malignant neoplasm of breast: Secondary | ICD-10-CM

## 2019-05-29 ENCOUNTER — Other Ambulatory Visit: Payer: Self-pay | Admitting: Primary Care

## 2019-05-29 DIAGNOSIS — I10 Essential (primary) hypertension: Secondary | ICD-10-CM

## 2019-06-19 DIAGNOSIS — M170 Bilateral primary osteoarthritis of knee: Secondary | ICD-10-CM

## 2019-06-19 DIAGNOSIS — I10 Essential (primary) hypertension: Secondary | ICD-10-CM

## 2019-06-19 MED ORDER — DICLOFENAC SODIUM 1 % TD GEL: 2.0000 g | Freq: Two times a day (BID) | TRANSDERMAL | 0 refills | Status: DC | PRN

## 2019-06-20 ENCOUNTER — Other Ambulatory Visit: Payer: Self-pay | Admitting: Primary Care

## 2019-06-20 DIAGNOSIS — I10 Essential (primary) hypertension: Secondary | ICD-10-CM

## 2019-06-24 MED ORDER — AMLODIPINE BESYLATE 10 MG PO TABS: 10.0000 mg | ORAL_TABLET | Freq: Every day | ORAL | 0 refills | Status: DC

## 2019-07-17 ENCOUNTER — Other Ambulatory Visit: Payer: Self-pay | Admitting: Primary Care

## 2019-07-17 DIAGNOSIS — I10 Essential (primary) hypertension: Secondary | ICD-10-CM

## 2019-08-12 ENCOUNTER — Other Ambulatory Visit: Payer: Self-pay | Admitting: Primary Care

## 2019-08-12 DIAGNOSIS — I10 Essential (primary) hypertension: Secondary | ICD-10-CM

## 2019-08-14 ENCOUNTER — Other Ambulatory Visit: Payer: Self-pay | Admitting: Primary Care

## 2019-08-14 DIAGNOSIS — E559 Vitamin D deficiency, unspecified: Secondary | ICD-10-CM

## 2019-08-14 DIAGNOSIS — I10 Essential (primary) hypertension: Secondary | ICD-10-CM

## 2019-08-14 DIAGNOSIS — R7303 Prediabetes: Secondary | ICD-10-CM

## 2019-08-18 ENCOUNTER — Other Ambulatory Visit (INDEPENDENT_AMBULATORY_CARE_PROVIDER_SITE_OTHER): Payer: BC Managed Care – PPO

## 2019-08-18 ENCOUNTER — Other Ambulatory Visit: Payer: Self-pay

## 2019-08-18 DIAGNOSIS — R7303 Prediabetes: Secondary | ICD-10-CM | POA: Diagnosis not present

## 2019-08-18 DIAGNOSIS — I10 Essential (primary) hypertension: Secondary | ICD-10-CM

## 2019-08-18 DIAGNOSIS — E559 Vitamin D deficiency, unspecified: Secondary | ICD-10-CM | POA: Diagnosis not present

## 2019-08-18 LAB — COMPREHENSIVE METABOLIC PANEL
ALT: 22 U/L (ref 0–35)
AST: 25 U/L (ref 0–37)
Albumin: 3.7 g/dL (ref 3.5–5.2)
Alkaline Phosphatase: 65 U/L (ref 39–117)
BUN: 10 mg/dL (ref 6–23)
CO2: 31 mEq/L (ref 19–32)
Calcium: 10.6 mg/dL — ABNORMAL HIGH (ref 8.4–10.5)
Chloride: 99 mEq/L (ref 96–112)
Creatinine, Ser: 0.96 mg/dL (ref 0.40–1.20)
GFR: 75.75 mL/min (ref >60.00)
Glucose, Bld: 108 mg/dL — ABNORMAL HIGH (ref 70–99)
Potassium: 3.4 mEq/L — ABNORMAL LOW (ref 3.5–5.1)
Sodium: 140 mEq/L (ref 135–145)
Total Bilirubin: 0.4 mg/dL (ref 0.2–1.2)
Total Protein: 7.7 g/dL (ref 6.0–8.3)

## 2019-08-18 LAB — CBC
HCT: 42.3 % (ref 36.0–46.0)
Hemoglobin: 14 g/dL (ref 12.0–15.0)
MCHC: 33.2 g/dL (ref 30.0–36.0)
MCV: 93.1 fl (ref 78.0–100.0)
Platelets: 341 10*3/uL (ref 150.0–400.0)
RBC: 4.54 Mil/uL (ref 3.87–5.11)
RDW: 14.1 % (ref 11.5–15.5)
WBC: 7.2 10*3/uL (ref 4.0–10.5)

## 2019-08-18 LAB — HEMOGLOBIN A1C: Hgb A1c MFr Bld: 5.7 % (ref 4.6–6.5)

## 2019-08-18 LAB — LIPID PANEL
Cholesterol: 179 mg/dL (ref 0–200)
HDL: 58.2 mg/dL (ref >39.00)
LDL Cholesterol: 102 mg/dL — ABNORMAL HIGH (ref 0–99)
NonHDL: 120.98
Total CHOL/HDL Ratio: 3
Triglycerides: 97 mg/dL (ref 0.0–149.0)
VLDL: 19.4 mg/dL (ref 0.0–40.0)

## 2019-08-18 LAB — VITAMIN D 25 HYDROXY (VIT D DEFICIENCY, FRACTURES): VITD: 67.55 ng/mL (ref 30.00–100.00)

## 2019-08-25 ENCOUNTER — Ambulatory Visit (INDEPENDENT_AMBULATORY_CARE_PROVIDER_SITE_OTHER): Payer: BC Managed Care – PPO | Admitting: Primary Care

## 2019-08-25 ENCOUNTER — Encounter: Payer: Self-pay | Admitting: Primary Care

## 2019-08-25 ENCOUNTER — Other Ambulatory Visit: Payer: Self-pay

## 2019-08-25 VITALS — BP 124/86 | HR 82 | Temp 97.4°F | Ht 64.5 in | Wt 257.8 lb

## 2019-08-25 DIAGNOSIS — L659 Nonscarring hair loss, unspecified: Secondary | ICD-10-CM | POA: Diagnosis not present

## 2019-08-25 DIAGNOSIS — Z0000 Encounter for general adult medical examination without abnormal findings: Secondary | ICD-10-CM

## 2019-08-25 DIAGNOSIS — I10 Essential (primary) hypertension: Secondary | ICD-10-CM

## 2019-08-25 DIAGNOSIS — Z6841 Body Mass Index (BMI) 40.0 and over, adult: Secondary | ICD-10-CM

## 2019-08-25 DIAGNOSIS — J302 Other seasonal allergic rhinitis: Secondary | ICD-10-CM

## 2019-08-25 DIAGNOSIS — K219 Gastro-esophageal reflux disease without esophagitis: Secondary | ICD-10-CM

## 2019-08-25 DIAGNOSIS — R053 Chronic cough: Secondary | ICD-10-CM | POA: Insufficient documentation

## 2019-08-25 DIAGNOSIS — Z1211 Encounter for screening for malignant neoplasm of colon: Secondary | ICD-10-CM

## 2019-08-25 DIAGNOSIS — R7303 Prediabetes: Secondary | ICD-10-CM

## 2019-08-25 DIAGNOSIS — R05 Cough: Secondary | ICD-10-CM

## 2019-08-25 DIAGNOSIS — E559 Vitamin D deficiency, unspecified: Secondary | ICD-10-CM

## 2019-08-25 HISTORY — DX: Chronic cough: R05.3

## 2019-08-25 MED ORDER — OMEPRAZOLE 20 MG PO CPDR: 20.0000 mg | DELAYED_RELEASE_CAPSULE | Freq: Every day | ORAL | 0 refills | Status: DC

## 2019-08-25 NOTE — Assessment & Plan Note (Addendum)
Suspect either allergy or GERD induced. Not on ACE/ARB, no prior history of asthma. Never a smoker. Discussed to start omeprazole nightly, continue Singulair. She will update in 1-2 months.

## 2019-08-25 NOTE — Patient Instructions (Addendum)
Start omeprazole 20 mg once nightly for heartburn.  Move Singulair to bedtime and take once nightly for allergies.  Continue exercising. You should be getting 150 minutes of moderate intensity exercise weekly.  Continue to work on a healthy diet. Ensure you are consuming 64 ounces of water daily.  You will be contacted regarding your referral to GI for the colonoscopy.  Please let us know if you have not been contacted within two weeks.   Please update me regarding the cough after a two months.  Schedule a lab only appointment for 3 months to repeat blood sugar, calcium, and potassium.  It was a pleasure to see you today!   Preventive Care 60-64 Years Old, Female Preventive care refers to visits with your health care provider and lifestyle choices that can promote health and wellness. This includes:  A yearly physical exam. This may also be called an annual well check.  Regular dental visits and eye exams.  Immunizations.  Screening for certain conditions.  Healthy lifestyle choices, such as eating a healthy diet, getting regular exercise, not using drugs or products that contain nicotine and tobacco, and limiting alcohol use. What can I expect for my preventive care visit? Physical exam Your health care provider will check your:  Height and weight. This may be used to calculate body mass index (BMI), which tells if you are at a healthy weight.  Heart rate and blood pressure.  Skin for abnormal spots. Counseling Your health care provider may ask you questions about your:  Alcohol, tobacco, and drug use.  Emotional well-being.  Home and relationship well-being.  Sexual activity.  Eating habits.  Work and work Statistician.  Method of birth control.  Menstrual cycle.  Pregnancy history. What immunizations do I need?  Influenza (flu) vaccine  This is recommended every year. Tetanus, diphtheria, and pertussis (Tdap) vaccine  You may need a Td booster every  10 years. Varicella (chickenpox) vaccine  You may need this if you have not been vaccinated. Zoster (shingles) vaccine  You may need this after age 7. Measles, mumps, and rubella (MMR) vaccine  You may need at least one dose of MMR if you were born in 1957 or later. You may also need a second dose. Pneumococcal conjugate (PCV13) vaccine  You may need this if you have certain conditions and were not previously vaccinated. Pneumococcal polysaccharide (PPSV23) vaccine  You may need one or two doses if you smoke cigarettes or if you have certain conditions. Meningococcal conjugate (MenACWY) vaccine  You may need this if you have certain conditions. Hepatitis A vaccine  You may need this if you have certain conditions or if you travel or work in places where you may be exposed to hepatitis A. Hepatitis B vaccine  You may need this if you have certain conditions or if you travel or work in places where you may be exposed to hepatitis B. Haemophilus influenzae type b (Hib) vaccine  You may need this if you have certain conditions. Human papillomavirus (HPV) vaccine  If recommended by your health care provider, you may need three doses over 6 months. You may receive vaccines as individual doses or as more than one vaccine together in one shot (combination vaccines). Talk with your health care provider about the risks and benefits of combination vaccines. What tests do I need? Blood tests  Lipid and cholesterol levels. These may be checked every 5 years, or more frequently if you are over 26 years old.  Hepatitis C test.  Hepatitis B test. Screening  Lung cancer screening. You may have this screening every year starting at age 73 if you have a 30-pack-year history of smoking and currently smoke or have quit within the past 15 years.  Colorectal cancer screening. All adults should have this screening starting at age 31 and continuing until age 61. Your health care provider may  recommend screening at age 37 if you are at increased risk. You will have tests every 1-10 years, depending on your results and the type of screening test.  Diabetes screening. This is done by checking your blood sugar (glucose) after you have not eaten for a while (fasting). You may have this done every 1-3 years.  Mammogram. This may be done every 1-2 years. Talk with your health care provider about when you should start having regular mammograms. This may depend on whether you have a family history of breast cancer.  BRCA-related cancer screening. This may be done if you have a family history of breast, ovarian, tubal, or peritoneal cancers.  Pelvic exam and Pap test. This may be done every 3 years starting at age 55. Starting at age 38, this may be done every 5 years if you have a Pap test in combination with an HPV test. Other tests  Sexually transmitted disease (STD) testing.  Bone density scan. This is done to screen for osteoporosis. You may have this scan if you are at high risk for osteoporosis. Follow these instructions at home: Eating and drinking  Eat a diet that includes fresh fruits and vegetables, whole grains, lean protein, and low-fat dairy.  Take vitamin and mineral supplements as recommended by your health care provider.  Do not drink alcohol if: ? Your health care provider tells you not to drink. ? You are pregnant, may be pregnant, or are planning to become pregnant.  If you drink alcohol: ? Limit how much you have to 0-1 drink a day. ? Be aware of how much alcohol is in your drink. In the [place] one drink equals one 12 oz bottle of beer (355 mL), one 5 oz glass of wine (148 mL), or one 1 oz glass of hard liquor (44 mL). Lifestyle  Take daily care of your teeth and gums.  Stay active. Exercise for at least 30 minutes on 5 or more days each week.  Do not use any products that contain nicotine or tobacco, such as cigarettes, e-cigarettes, and chewing tobacco. If  you need help quitting, ask your health care provider.  If you are sexually active, practice safe sex. Use a condom or other form of birth control (contraception) in order to prevent pregnancy and STIs (sexually transmitted infections).  If told by your health care provider, take low-dose aspirin daily starting at age 84. What's next?  Visit your health care provider once a year for a well check visit.  Ask your health care provider how often you should have your eyes and teeth checked.  Stay up to date on all vaccines. This information is not intended to replace advice given to you by your health care provider. Make sure you discuss any questions you have with your health care provider. Document Released: 10/08/2015 Document Revised: 05/23/2018 Document Reviewed: 05/23/2018 Elsevier Patient Education  2020 [employer].

## 2019-08-25 NOTE — Progress Notes (Signed)
Subjective:    Patient ID: Rachel Swanson, female    DOB: 11-09-1975, 43 y.o.   MRN: [id number]  HPI  Rachel Swanson is a 43 year old female who presents today for complete physical.  Immunizations: -Tetanus: Completed in 2016 -Influenza: Completed this season   Diet: She endorses a healthy diet. Has been increasing salads and vegetables.  Exercise: She is exercising 2-3 times weekly on average.   Eye exam: Completed in 2020 Dental exam: Completes semi-annually   Pap Smear: Completed in 2020 Mammogram: Completed in 2020 Colonoscopy: Never completed, referral placed  BP Readings from Last 3 Encounters:  08/25/19 124/86  03/13/19 140/86  10/17/18 118/70   Wt Readings from Last 3 Encounters:  08/25/19 257 lb 12 oz (116.9 kg)  03/13/19 248 lb (112.5 kg)  10/17/18 243 lb 12 oz (110.6 kg)     Review of Systems  Constitutional: Negative for unexpected weight change.  HENT: Negative for rhinorrhea.   Respiratory: Negative for cough and shortness of breath.   Cardiovascular: Negative for chest pain.  Gastrointestinal: Negative for constipation and diarrhea.  Genitourinary: Negative for difficulty urinating.  Musculoskeletal: Negative for arthralgias.  Skin: Negative for rash.       Chronic hair thinning, improved  Allergic/Immunologic: Positive for environmental allergies.  Neurological: Negative for dizziness, numbness and headaches.  Psychiatric/Behavioral: The patient is not nervous/anxious.        Past Medical History:  Diagnosis Date  . Hypertension   . Seasonal allergies      Social History   Socioeconomic History  . Marital status: Single    Spouse name: Not on file  . Number of children: Not on file  . Years of education: Not on file  . Highest education level: Not on file  Occupational History  . Not on file  Social Needs  . Financial resource strain: Not on file  . Food insecurity    Worry: Not on file    Inability: Not on file  .  Transportation needs    Medical: Not on file    Non-medical: Not on file  Tobacco Use  . Smoking status: Never Smoker  . Smokeless tobacco: Never Used  Substance and Sexual Activity  . Alcohol use: No  . Drug use: No  . Sexual activity: Not Currently    Partners: Male    Birth control/protection: Condom  Lifestyle  . Physical activity    Days per week: Not on file    Minutes per session: Not on file  . Stress: Not on file  Relationships  . Social Herbalist on phone: Not on file    Gets together: Not on file    Attends religious service: Not on file    Active member of club or organization: Not on file    Attends meetings of clubs or organizations: Not on file    Relationship status: Not on file  . Intimate partner violence    Fear of current or ex partner: Not on file    Emotionally abused: Not on file    Physically abused: Not on file    Forced sexual activity: Not on file  Other Topics Concern  . 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     Past Surgical History:  Procedure Laterality Date  . BUNIONECTOMY Right   . WISDOM TOOTH EXTRACTION      Family  History  Problem Relation Age of Onset  . Heart disease Mother   . Hyperlipidemia Father   . Heart attack Father   . Diabetes Sister   . Breast cancer Paternal Aunt   . Breast cancer Cousin        paternal    Allergies  Allergen Reactions  . Ace Inhibitors Cough    Current Outpatient Medications on File Prior to Visit  Medication Sig Dispense Refill  . albuterol (PROVENTIL HFA;VENTOLIN HFA) 108 (90 Base) MCG/ACT inhaler Inhale 2 puffs into the lungs every 4 (four) hours as needed for shortness of breath. 1 Inhaler 0  . amLODipine (NORVASC) 10 MG tablet Take 1 tablet (10 mg total) by mouth daily. For blood pressure 30 tablet 0  . diclofenac sodium (VOLTAREN) 1 % GEL Apply 2 g topically 2 (two) times daily as needed.  100 g 0  . fluticasone (FLONASE) 50 MCG/ACT nasal spray PLACE 1 SPRAY INTO BOTH NOSTRILS 2 (TWO) TIMES DAILY 48 mL 1  . hydrochlorothiazide (HYDRODIURIL) 25 MG tablet TAKE 1 TABLET BY MOUTH EVERY DAY 90 tablet 2  . montelukast (SINGULAIR) 10 MG tablet TAKE 1 TABLET BY MOUTH EVERYDAY AT BEDTIME 90 tablet 1   No current facility-administered medications on file prior to visit.     BP 124/86   Pulse 82   Temp (!) 97.4 F (36.3 C) (Temporal)   Ht 5' 4.5" (1.638 m)   Wt 257 lb 12 oz (116.9 kg)   LMP 08/18/2019   SpO2 98%   BMI 43.56 kg/m    Objective:   Physical Exam  Constitutional: She is oriented to person, place, and time. She appears well-nourished.  HENT:  Right Ear: Tympanic membrane and ear canal normal.  Left Ear: Tympanic membrane and ear canal normal.  Mouth/Throat: Oropharynx is clear and moist.  Eyes: Pupils are equal, round, and reactive to light. EOM are normal.  Neck: Neck supple.  Cardiovascular: Normal rate and regular rhythm.  Respiratory: Effort normal and breath sounds normal.  GI: Soft. Bowel sounds are normal. There is no abdominal tenderness.  Musculoskeletal: Normal range of motion.  Neurological: She is alert and oriented to person, place, and time. No cranial nerve deficit.  Reflex Scores:      Patellar reflexes are 2+ on the right side and 2+ on the left side. Skin: Skin is warm and dry.  Psychiatric: She has a normal mood and affect.           Assessment & Plan:

## 2019-08-25 NOTE — Assessment & Plan Note (Addendum)
Doing well on omeprazole when she remembers to take, does have cough and belching. Rx for omeprazole 20 mg sent to pharmacy, encouraged nightly compliance. She will update.

## 2019-08-25 NOTE — Assessment & Plan Note (Signed)
Immunizations UTD. Pap smear UTD. Mammogram UTD. Colonoscopy due, referral placed to GI. Discussed the importance of a healthy diet and regular exercise in order for weight loss, and to reduce the risk of any potential medical problems. Exam today unremarkable. Labs reviewed.

## 2019-08-25 NOTE — Assessment & Plan Note (Signed)
Stable in the office today, continue current regimen. 

## 2019-08-25 NOTE — Assessment & Plan Note (Signed)
Following with dermatology through [facility], undergoing injections. Continue same.

## 2019-08-25 NOTE — Assessment & Plan Note (Signed)
Compliant to vitamin D, recent levels stable. Continue vitamin D.

## 2019-08-25 NOTE — Assessment & Plan Note (Addendum)
Doing well on daily Singulair, continue same. Cough could be caused by allergies, also by allergies aggravating GERD. Also start daily omeprazole. She will update.

## 2019-08-25 NOTE — Assessment & Plan Note (Signed)
Discussed the importance of a healthy diet and regular exercise in order for weight loss, and to reduce the risk of any potential medical problems.  

## 2019-08-25 NOTE — Assessment & Plan Note (Signed)
Recent A1C of 5.7 which is exactly the same as last year.  Continue regular exercise, work on a healthy diet.

## 2019-08-26 ENCOUNTER — Encounter: Payer: Self-pay | Admitting: Gastroenterology

## 2019-09-03 ENCOUNTER — Other Ambulatory Visit: Payer: Self-pay | Admitting: Primary Care

## 2019-09-03 DIAGNOSIS — I10 Essential (primary) hypertension: Secondary | ICD-10-CM

## 2019-09-28 ENCOUNTER — Other Ambulatory Visit: Payer: Self-pay | Admitting: Primary Care

## 2019-09-28 DIAGNOSIS — J302 Other seasonal allergic rhinitis: Secondary | ICD-10-CM

## 2019-09-30 ENCOUNTER — Encounter: Payer: Self-pay | Admitting: Gastroenterology

## 2019-09-30 ENCOUNTER — Ambulatory Visit (INDEPENDENT_AMBULATORY_CARE_PROVIDER_SITE_OTHER): Payer: BC Managed Care – PPO | Admitting: Gastroenterology

## 2019-09-30 VITALS — BP 142/90 | HR 92 | Temp 98.2°F | Ht 64.25 in | Wt 258.5 lb

## 2019-09-30 DIAGNOSIS — Z1211 Encounter for screening for malignant neoplasm of colon: Secondary | ICD-10-CM

## 2019-09-30 NOTE — Progress Notes (Signed)
[facility] Consult Note:  History: Rachel Swanson 09/30/2019  Referring provider: Pleas Koch, NP  Reason for consult/chief complaint: Colon Cancer Screening (due to age)   Subjective  HPI:  This is a very pleasant 44 year old woman referred by primary care for consideration of colon cancer screening.  She has regular bowel movements, denies rectal bleeding or family history of colon or rectal cancer.  She has intermittent heartburn and belching, denies dysphagia odynophagia nausea or vomiting.  Symptoms under control if she adheres to an antireflux diet and takes omeprazole regularly.  ROS:  Review of Systems  Constitutional: Negative for appetite change and unexpected weight change.  HENT: Negative for mouth sores and voice change.   Eyes: Negative for pain and redness.  Respiratory: Negative for cough and shortness of breath.   Cardiovascular: Negative for chest pain and palpitations.  Genitourinary: Negative for dysuria and hematuria.  Musculoskeletal: Negative for arthralgias and myalgias.  Skin: Negative for pallor and rash.  Allergic/Immunologic: Positive for environmental allergies.  Neurological: Negative for weakness and headaches.  Hematological: Negative for adenopathy.     Past Medical History: Past Medical History:  Diagnosis Date  . GERD (gastroesophageal reflux disease)   . Hypertension   . Prediabetes   . Seasonal allergies      Past Surgical History: Past Surgical History:  Procedure Laterality Date  . BUNIONECTOMY Right   . WISDOM TOOTH EXTRACTION       Family History: Family History  Problem Relation Age of Onset  . Heart disease Mother   . Hypertension Mother   . Hyperlipidemia Father   . Heart attack Father   . Diabetes Sister   . Breast cancer Paternal Aunt   . Breast cancer Cousin        paternal    Social History: Social History   Socioeconomic History  . Marital status: Single    Spouse  name: Not on file  . Number of children: 0  . Years of education: Not on file  . Highest education level: Not on file  Occupational History  . Occupation: Pharmacist, hospital  Tobacco Use  . Smoking status: Never Smoker  . Smokeless tobacco: Never Used  Substance and Sexual Activity  . Alcohol use: Yes    Comment: 1-2 per month  . Drug use: No  . Sexual activity: Not Currently    Partners: Male    Birth control/protection: Condom  Other Topics Concern  . 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    Social Determinants of Health   Financial Resource Strain:   . Difficulty of Paying Living Expenses: Not on file  Food Insecurity:   . Worried About Charity fundraiser in the Last Year: Not on file  . Ran Out of Food in the Last Year: Not on file  Transportation Needs:   . Lack of Transportation (Medical): Not on file  . Lack of Transportation (Non-Medical): Not on file  Physical Activity:   . Days of Exercise per Week: Not on file  . Minutes of Exercise per Session: Not on file  Stress:   . Feeling of Stress : Not on file  Social Connections:   . Frequency of Communication with Friends and Family: Not on file  . Frequency of Social Gatherings with Friends and Family: Not on file  . Attends Religious Services: Not on file  . Active Member of  Clubs or Organizations: Not on file  . Attends Archivist Meetings: Not on file  . 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  Allergies: Allergies  Allergen Reactions  . Ace Inhibitors Cough    Outpatient Meds: Current Outpatient Medications  Medication Sig Dispense Refill  . albuterol (PROVENTIL HFA;VENTOLIN HFA) 108 (90 Base) MCG/ACT inhaler Inhale 2 puffs into the lungs every 4 (four) hours as needed for shortness of breath. 1 Inhaler 0  . amLODipine (NORVASC) 10 MG tablet TAKE 1  TABLET BY MOUTH EVERY DAY FOR BLOOD PRESSURE 90 tablet 1  . diclofenac sodium (VOLTAREN) 1 % GEL Apply 2 g topically 2 (two) times daily as needed. 100 g 0  . fluticasone (FLONASE) 50 MCG/ACT nasal spray PLACE 1 SPRAY INTO BOTH NOSTRILS 2 (TWO) TIMES DAILY 48 mL 1  . hydrochlorothiazide (HYDRODIURIL) 25 MG tablet TAKE 1 TABLET BY MOUTH EVERY DAY 90 tablet 2  . montelukast (SINGULAIR) 10 MG tablet TAKE 1 TABLET BY MOUTH EVERYDAY AT BEDTIME 90 tablet 1  . omeprazole (PRILOSEC) 20 MG capsule Take 1 capsule (20 mg total) by mouth daily. For heartburn. 90 capsule 0   No current facility-administered medications for this visit.      ___________________________________________________________________ Objective   Exam:  BP (!) 142/90 (BP Location: Left Arm, Patient Position: Sitting, Cuff Size: Large)   Pulse 92   Temp 98.2 F (36.8 C)   Ht 5' 4.25" (1.632 m) Comment: height measured without shoes  Wt 258 lb 8 oz (117.3 kg)   LMP 09/08/2019   BMI 44.03 kg/m    General: Well-appearing, normal vocal quality  Eyes: sclera anicteric, no redness  ENT: oral mucosa moist without lesions, no cervical or supraclavicular lymphadenopathy  CV: RRR without murmur, S1/S2, no JVD, no peripheral edema  Resp: clear to auscultation bilaterally, normal RR and effort noted  GI: soft, no tenderness, with active bowel sounds. No guarding or palpable organomegaly noted.  Skin; warm and dry, no rash or jaundice noted  Neuro: awake, alert and oriented x 3. Normal gross motor function and fluent speech   Assessment: Encounter Diagnosis  Name Primary?  . Special screening for malignant neoplasms, colon Yes    African-American over age 48, offered colorectal cancer screening with colonoscopy.  She was agreeable after discussion of procedure and risks.  The benefits and risks of the planned procedure were described in detail with the patient or (when appropriate) their health care proxy.  Risks were  outlined as including, but not limited to, bleeding, infection, perforation, adverse medication reaction leading to cardiac or pulmonary decompensation, pancreatitis (if ERCP).  The limitation of incomplete mucosal visualization was also discussed.  No guarantees or warranties were given.   Thank you for the courtesy of this consult.  Please call me with any questions or concerns.  Nelida Meuse III  CC: Referring provider noted above

## 2019-09-30 NOTE — Patient Instructions (Signed)
If you are age 46 or older, your body mass index should be between 23-30. Your Body mass index is 44.03 kg/m. If this is out of the aforementioned range listed, please consider follow up with your Primary Care Provider.  If you are age 65 or younger, your body mass index should be between 19-25. Your Body mass index is 44.03 kg/m. If this is out of the aformentioned range listed, please consider follow up with your Primary Care Provider.   It has been recommended to you by your physician that you have a(n) Colonoscopy completed. Per your request, we did not schedule the procedure(s) today. Please contact our office at [phone] should you decide to have the procedure completed. Ask for Vivien Rota, CMA You will also be required to have a COVID screening test 2 days prior to the procedure date.   It was a pleasure to see you today!  Dr. Loletha Carrow

## 2019-10-09 ENCOUNTER — Telehealth: Payer: Self-pay | Admitting: Gastroenterology

## 2019-10-09 NOTE — Telephone Encounter (Signed)
Pt called regarding this msg.

## 2019-10-09 NOTE — Telephone Encounter (Signed)
Pt is scheduled for a colonoscopy 10/17/19 at 4:00 PM.  Please review instructions with patient.

## 2019-10-09 NOTE — Telephone Encounter (Signed)
Ive bee in clinic all day, can you please call her and let her know the instructions came over via [facility]. I will call her as soon as clinic is done.

## 2019-10-13 ENCOUNTER — Other Ambulatory Visit: Payer: Self-pay

## 2019-10-13 MED ORDER — NA SULFATE-K SULFATE-MG SULF 17.5-3.13-1.6 GM/177ML PO SOLN: 1.0000 | Freq: Once | ORAL | 0 refills | Status: AC

## 2019-10-13 NOTE — Telephone Encounter (Signed)
Reviewed the information and instructions with the patient.

## 2019-10-15 ENCOUNTER — Other Ambulatory Visit: Payer: Self-pay | Admitting: Gastroenterology

## 2019-10-16 ENCOUNTER — Telehealth: Payer: Self-pay | Admitting: Gastroenterology

## 2019-10-16 LAB — SARS CORONAVIRUS 2 (TAT 6-24 HRS): SARS Coronavirus 2: NEGATIVE

## 2019-10-16 MED ORDER — NA SULFATE-K SULFATE-MG SULF 17.5-3.13-1.6 GM/177ML PO SOLN: 1.0000 | Freq: Once | ORAL | 0 refills | Status: AC

## 2019-10-16 NOTE — Telephone Encounter (Signed)
Pt states that her pharmacy did not receive order for suprep. Her procedure is tomorrow 1/22.

## 2019-10-16 NOTE — Telephone Encounter (Signed)
suprep has been sent

## 2019-10-17 ENCOUNTER — Other Ambulatory Visit: Payer: Self-pay

## 2019-10-17 ENCOUNTER — Encounter: Payer: Self-pay | Admitting: Gastroenterology

## 2019-10-17 ENCOUNTER — Ambulatory Visit (AMBULATORY_SURGERY_CENTER): Payer: BC Managed Care – PPO | Admitting: Gastroenterology

## 2019-10-17 VITALS — BP 114/66 | HR 73 | Temp 97.7°F | Resp 14 | Ht 64.25 in | Wt 258.0 lb

## 2019-10-17 DIAGNOSIS — D122 Benign neoplasm of ascending colon: Secondary | ICD-10-CM

## 2019-10-17 DIAGNOSIS — Z1211 Encounter for screening for malignant neoplasm of colon: Secondary | ICD-10-CM

## 2019-10-17 MED ORDER — SODIUM CHLORIDE 0.9 % IV SOLN: 500.0000 mL | Freq: Once | INTRAVENOUS | Status: DC

## 2019-10-17 NOTE — Progress Notes (Signed)
A and O x3. Report to RN. Tolerated MAC anesthesia well.

## 2019-10-17 NOTE — Progress Notes (Signed)
Temp LC  VS DT  Pt's states no medical or surgical changes since previsit or office visit.  Admitting RN reviewed. 

## 2019-10-17 NOTE — Progress Notes (Signed)
Called to room to assist during endoscopic procedure.  Patient ID and intended procedure confirmed with present staff. Received instructions for my participation in the procedure from the performing physician.  

## 2019-10-17 NOTE — Op Note (Signed)
[place] Patient Name: Rachel Swanson Procedure Date: 10/17/2019 3:41 PM MRN: [id number] Endoscopist: Mallie Mussel L. Loletha Carrow , MD Age: 44 Referring MD:  Date of Birth: [birth year] Gender: Female Account #: [id number] Procedure:                Colonoscopy Indications:              Screening for colorectal malignant neoplasm (AA>                            23), This is the patient's first colonoscopy Medicines:                Monitored Anesthesia Care Procedure:                Pre-Anesthesia Assessment:                           - Prior to the procedure, a History and Physical                            was performed, and patient medications and                            allergies were reviewed. The patient's tolerance of                            previous anesthesia was also reviewed. The risks                            and benefits of the procedure and the sedation                            options and risks were discussed with the patient.                            All questions were answered, and informed consent                            was obtained. Prior Anticoagulants: The patient has                            taken no previous anticoagulant or antiplatelet                            agents. ASA Grade Assessment: III - A patient with                            severe systemic disease. After reviewing the risks                            and benefits, the patient was deemed in                            satisfactory condition to undergo the procedure.  After obtaining informed consent, the colonoscope                            was passed under direct vision. Throughout the                            procedure, the patient's blood pressure, pulse, and                            oxygen saturations were monitored continuously. The                            Colonoscope was introduced through the anus and                            advanced  to the the cecum, identified by                            appendiceal orifice and ileocecal valve. The                            colonoscopy was performed without difficulty. The                            patient tolerated the procedure well. The quality                            of the bowel preparation was excellent. The                            ileocecal valve, appendiceal orifice, and rectum                            were photographed. The bowel preparation used was                            SUPREP. Scope In: 4:06:52 PM Scope Out: 4:18:36 PM Scope Withdrawal Time: 0 hours 9 minutes 39 seconds  Total Procedure Duration: 0 hours 11 minutes 44 seconds  Findings:                 The perianal and digital rectal examinations were                            normal.                           Multiple diverticula were found in the left colon                            and right colon.                           A diminutive polyp was found in the ascending  colon. The polyp was sessile. The polyp was removed                            with a cold snare. Resection and retrieval were                            complete.                           The exam was otherwise without abnormality on                            direct and retroflexion views. Complications:            No immediate complications. Estimated Blood Loss:     Estimated blood loss was minimal. Impression:               - Diverticulosis in the left colon and in the right                            colon.                           - One diminutive polyp in the ascending colon,                            removed with a cold snare. Resected and retrieved.                           - The examination was otherwise normal on direct                            and retroflexion views. Recommendation:           - Patient has a contact number available for                            emergencies. The signs  and symptoms of potential                            delayed complications were discussed with the                            patient. Return to normal activities tomorrow.                            Written discharge instructions were provided to the                            patient.                           - Resume previous diet.                           - Continue present medications.                           -  Await pathology results.                           - Repeat colonoscopy is recommended for                            surveillance. The colonoscopy date will be                            determined after pathology results from today's                            exam become available for review. Orin Eberwein L. Loletha Carrow, MD 10/17/2019 4:23:01 PM This report has been signed electronically.

## 2019-10-17 NOTE — Patient Instructions (Signed)
Handout on polyps, diverticulosis   YOU HAD AN ENDOSCOPIC PROCEDURE TODAY AT [facility]:   Refer to the procedure report that was given to you for any specific questions about what was found during the examination.  If the procedure report does not answer your questions, please call your gastroenterologist to clarify.  If you requested that your care partner not be given the details of your procedure findings, then the procedure report has been included in a sealed envelope for you to review at your convenience later.  YOU SHOULD EXPECT: Some feelings of bloating in the abdomen. Passage of more gas than usual.  Walking can help get rid of the air that was put into your GI tract during the procedure and reduce the bloating. If you had a lower endoscopy (such as a colonoscopy or flexible sigmoidoscopy) you may notice spotting of blood in your stool or on the toilet paper. If you underwent a bowel prep for your procedure, you may not have a normal bowel movement for a few days.  Please Note:  You might notice some irritation and congestion in your nose or some drainage.  This is from the oxygen used during your procedure.  There is no need for concern and it should clear up in a day or so.  SYMPTOMS TO REPORT IMMEDIATELY:   Following lower endoscopy (colonoscopy or flexible sigmoidoscopy):  Excessive amounts of blood in the stool  Significant tenderness or worsening of abdominal pains  Swelling of the abdomen that is new, acute  Fever of 100F or higher  For urgent or emergent issues, a gastroenterologist can be reached at any hour by calling [phone].   DIET:  We do recommend a small meal at first, but then you may proceed to your regular diet.  Drink plenty of fluids but you should avoid alcoholic beverages for 24 hours.  ACTIVITY:  You should plan to take it easy for the rest of today and you should NOT DRIVE or use heavy machinery until tomorrow (because of the  sedation medicines used during the test).    FOLLOW UP: Our staff will call the number listed on your records 48-72 hours following your procedure to check on you and address any questions or concerns that you may have regarding the information given to you following your procedure. If we do not reach you, we will leave a message.  We will attempt to reach you two times.  During this call, we will ask if you have developed any symptoms of COVID 19. If you develop any symptoms (ie: fever, flu-like symptoms, shortness of breath, cough etc.) before then, please call [phone].  If you test positive for Covid 19 in the 2 weeks post procedure, please call and report this information to [place].    If any biopsies were taken you will be contacted by phone or by letter within the next 1-3 weeks.  Please call us at [phone] if you have not heard about the biopsies in 3 weeks.    SIGNATURES/CONFIDENTIALITY: You and/or your care partner have signed paperwork which will be entered into your electronic medical record.  These signatures attest to the fact that that the information above on your After Visit Summary has been reviewed and is understood.  Full responsibility of the confidentiality of this discharge information lies with you and/or your care-partner.

## 2019-10-21 ENCOUNTER — Telehealth: Payer: Self-pay

## 2019-10-21 NOTE — Telephone Encounter (Signed)
Left message on follow up call. 

## 2019-10-21 NOTE — Telephone Encounter (Signed)
First attempt follow up call to pt, lm on vm 

## 2019-10-27 ENCOUNTER — Encounter: Payer: Self-pay | Admitting: Gastroenterology

## 2019-10-29 ENCOUNTER — Other Ambulatory Visit: Payer: Self-pay | Admitting: Primary Care

## 2019-10-29 DIAGNOSIS — J3089 Other allergic rhinitis: Secondary | ICD-10-CM

## 2019-11-14 ENCOUNTER — Other Ambulatory Visit: Payer: Self-pay | Admitting: Primary Care

## 2019-11-14 DIAGNOSIS — I10 Essential (primary) hypertension: Secondary | ICD-10-CM

## 2019-11-17 ENCOUNTER — Other Ambulatory Visit: Payer: Self-pay | Admitting: Primary Care

## 2019-11-17 DIAGNOSIS — K219 Gastro-esophageal reflux disease without esophagitis: Secondary | ICD-10-CM

## 2019-11-25 ENCOUNTER — Other Ambulatory Visit: Payer: Self-pay

## 2019-11-25 ENCOUNTER — Other Ambulatory Visit: Payer: BC Managed Care – PPO

## 2019-12-16 ENCOUNTER — Encounter: Payer: Self-pay | Admitting: Primary Care

## 2019-12-16 ENCOUNTER — Ambulatory Visit: Payer: BC Managed Care – PPO | Admitting: Primary Care

## 2019-12-16 ENCOUNTER — Other Ambulatory Visit: Payer: Self-pay

## 2019-12-16 VITALS — BP 122/80 | HR 63 | Temp 96.8°F | Wt 265.8 lb

## 2019-12-16 DIAGNOSIS — M25562 Pain in left knee: Secondary | ICD-10-CM

## 2019-12-16 DIAGNOSIS — I10 Essential (primary) hypertension: Secondary | ICD-10-CM | POA: Diagnosis not present

## 2019-12-16 DIAGNOSIS — M25561 Pain in right knee: Secondary | ICD-10-CM | POA: Diagnosis not present

## 2019-12-16 DIAGNOSIS — G8929 Other chronic pain: Secondary | ICD-10-CM

## 2019-12-16 DIAGNOSIS — R7303 Prediabetes: Secondary | ICD-10-CM | POA: Diagnosis not present

## 2019-12-16 DIAGNOSIS — K219 Gastro-esophageal reflux disease without esophagitis: Secondary | ICD-10-CM | POA: Diagnosis not present

## 2019-12-16 LAB — POCT GLYCOSYLATED HEMOGLOBIN (HGB A1C): Hemoglobin A1C: 5.3 % (ref 4.0–5.6)

## 2019-12-16 MED ORDER — DICLOFENAC SODIUM 1 % EX GEL: 2.0000 g | Freq: Four times a day (QID) | CUTANEOUS | 11 refills | Status: DC

## 2019-12-16 NOTE — Assessment & Plan Note (Signed)
Stable in the office today, continue HCTZ and amlodipine.  BMP is up to date.

## 2019-12-16 NOTE — Progress Notes (Signed)
Subjective:    Patient ID: Rachel Swanson, female    DOB: 1975-11-29, 44 y.o.   MRN: [id number]  HPI  This visit occurred during the SARS-CoV-2 public health emergency.  Safety protocols were in place, including screening questions prior to the visit, additional usage of staff PPE, and extensive cleaning of exam room while observing appropriate contact time as indicated for disinfecting solutions.   Ms. Rachel Swanson is a 44 year old female with a history of hypertension, GERD, vitamin D deficiency, prediabetes, chronic knee pain who presents today for follow up.   Managed on amlodipine 10 mg and HCTZ 25 mg. She denies dizziness, chest pain, blurred vision.   BP Readings from Last 3 Encounters:  12/16/19 122/80  10/17/19 114/66  09/30/19 (!) 142/90   Doing well with Voltaren Gel to the knees, using sparingly as needed. Needs refills.   Taking omeprazole 20 mg once daily since November 2020, doing well. She does have breakthrough belching, drinks a lot of carbonated water.   Review of Systems  Eyes: Negative for visual disturbance.  Respiratory: Negative for shortness of breath.   Cardiovascular: Negative for chest pain.  Neurological: Negative for dizziness and headaches.       Past Medical History:  Diagnosis Date  . GERD (gastroesophageal reflux disease)   . Hypertension   . Prediabetes   . Seasonal allergies      Social History   Socioeconomic History  . Marital status: Single    Spouse name: Not on file  . Number of children: 0  . Years of education: Not on file  . Highest education level: Not on file  Occupational History  . Occupation: Pharmacist, hospital  Tobacco Use  . Smoking status: Never Smoker  . Smokeless tobacco: Never Used  Substance and Sexual Activity  . Alcohol use: Yes    Comment: 1-2 per month  . Drug use: No  . Sexual activity: Not Currently    Partners: Male    Birth control/protection: Condom  Other Topics Concern  . Not on file  Social  History Narrative   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    Social Determinants of Health   Financial Resource Strain:   . Difficulty of Paying Living Expenses:   Food Insecurity:   . Worried About Charity fundraiser in the Last Year:   . Arboriculturist in the Last Year:   Transportation Needs:   . Film/video editor (Medical):   Marland Kitchen Lack of Transportation (Non-Medical):   Physical Activity:   . Days of Exercise per Week:   . Minutes of Exercise per Session:   Stress:   . Feeling of Stress :   Social Connections:   . Frequency of Communication with Friends and Family:   . Frequency of Social Gatherings with Friends and Family:   . Attends Religious Services:   . Active Member of Clubs or Organizations:   . Attends Archivist Meetings:   Marland Kitchen Marital Status:   Intimate Partner Violence:   . Fear of Current or Ex-Partner:   . Emotionally Abused:   Marland Kitchen Physically Abused:   . Sexually Abused:     Past Surgical History:  Procedure Laterality Date  . BUNIONECTOMY Right   . WISDOM TOOTH EXTRACTION      Family History  Problem Relation Age of Onset  . Heart disease Mother   . Hypertension Mother   . Hyperlipidemia Father   .  Heart attack Father   . Diabetes Sister   . Breast cancer Paternal Aunt   . Breast cancer Cousin        paternal  . Colon cancer Neg Hx   . Esophageal cancer Neg Hx   . Rectal cancer Neg Hx   . Stomach cancer Neg Hx     Allergies  Allergen Reactions  . Ace Inhibitors Cough    Current Outpatient Medications on File Prior to Visit  Medication Sig Dispense Refill  . amLODipine (NORVASC) 10 MG tablet TAKE 1 TABLET BY MOUTH EVERY DAY FOR BLOOD PRESSURE 90 tablet 1  . fluticasone (FLONASE) 50 MCG/ACT nasal spray PLACE 1 SPRAY INTO BOTH NOSTRILS 2 (TWO) TIMES DAILY 48 mL 1  . hydrochlorothiazide (HYDRODIURIL) 25 MG tablet TAKE 1 TABLET BY MOUTH EVERY DAY 90 tablet 2  . montelukast (SINGULAIR) 10  MG tablet TAKE 1 TABLET BY MOUTH EVERYDAY AT BEDTIME 90 tablet 1  . omeprazole (PRILOSEC) 20 MG capsule TAKE 1 CAPSULE (20 MG TOTAL) BY MOUTH DAILY. FOR HEARTBURN. 90 capsule 3   No current facility-administered medications on file prior to visit.    BP 122/80 (BP Location: Right Arm, Patient Position: Sitting, Cuff Size: Large)   Pulse 63   Temp (!) 96.8 F (36 C) (Temporal)   Wt 265 lb 12.8 oz (120.6 kg)   LMP 12/01/2019   SpO2 93%   BMI 45.27 kg/m    Objective:   Physical Exam  Constitutional: She appears well-nourished.  Cardiovascular: Normal rate and regular rhythm.  Respiratory: Effort normal and breath sounds normal.  Musculoskeletal:     Cervical back: Neck supple.  Skin: Skin is warm and dry.  Psychiatric: She has a normal mood and affect.           Assessment & Plan:

## 2019-12-16 NOTE — Assessment & Plan Note (Signed)
Repeat A1C pending.  Discussed the importance of a healthy diet and regular exercise in order for weight loss, and to reduce the risk of any potential medical problems.  

## 2019-12-16 NOTE — Patient Instructions (Addendum)
Stop by the lab prior to leaving today. I will notify you of your results once received.   Start exercising. You should be getting 150 minutes of moderate intensity exercise weekly.  It's important to improve your diet by reducing consumption of fast food, fried food, processed snack foods, sugary drinks. Increase consumption of fresh vegetables and fruits, whole grains, water.  Ensure you are drinking 64 ounces of water daily.  We will see you in December or early 2022 for your physical.  It was a pleasure to see you today!

## 2019-12-16 NOTE — Assessment & Plan Note (Signed)
Much improved on daily PPI, would like to see her try to wean off. Discussed to trial every other day for 1-2 months, then reduce further. Use Tums in between if needed. She will update.

## 2019-12-16 NOTE — Assessment & Plan Note (Signed)
Refills provided for Diclofenac Gel.  Encouraged weight loss.

## 2019-12-17 LAB — BASIC METABOLIC PANEL
BUN: 10 mg/dL (ref 6–23)
CO2: 32 mEq/L (ref 19–32)
Calcium: 10.4 mg/dL (ref 8.4–10.5)
Chloride: 98 mEq/L (ref 96–112)
Creatinine, Ser: 0.9 mg/dL (ref 0.40–1.20)
GFR: 81.49 mL/min (ref >60.00)
Glucose, Bld: 93 mg/dL (ref 70–99)
Potassium: 4.1 mEq/L (ref 3.5–5.1)
Sodium: 137 mEq/L (ref 135–145)

## 2020-01-15 ENCOUNTER — Encounter: Payer: Self-pay | Admitting: Radiology

## 2020-01-25 ENCOUNTER — Other Ambulatory Visit: Payer: Self-pay | Admitting: Primary Care

## 2020-01-25 DIAGNOSIS — I10 Essential (primary) hypertension: Secondary | ICD-10-CM

## 2020-02-10 ENCOUNTER — Other Ambulatory Visit: Payer: Self-pay | Admitting: Primary Care

## 2020-02-10 DIAGNOSIS — Z1231 Encounter for screening mammogram for malignant neoplasm of breast: Secondary | ICD-10-CM

## 2020-03-15 ENCOUNTER — Other Ambulatory Visit: Payer: Self-pay

## 2020-03-15 ENCOUNTER — Ambulatory Visit (INDEPENDENT_AMBULATORY_CARE_PROVIDER_SITE_OTHER): Payer: BC Managed Care – PPO | Admitting: Obstetrics & Gynecology

## 2020-03-15 ENCOUNTER — Other Ambulatory Visit (HOSPITAL_COMMUNITY)
Admission: RE | Admit: 2020-03-15 | Discharge: 2020-03-15 | Disposition: A | Discharge status: Home / Self Care | Payer: BC Managed Care – PPO | Source: Ambulatory Visit | Attending: Obstetrics & Gynecology | Admitting: Obstetrics & Gynecology

## 2020-03-15 ENCOUNTER — Encounter: Payer: Self-pay | Admitting: Obstetrics & Gynecology

## 2020-03-15 VITALS — BP 135/88 | HR 76 | Ht 65.0 in | Wt 259.0 lb

## 2020-03-15 DIAGNOSIS — Z3009 Encounter for other general counseling and advice on contraception: Secondary | ICD-10-CM

## 2020-03-15 DIAGNOSIS — Z01411 Encounter for gynecological examination (general) (routine) with abnormal findings: Secondary | ICD-10-CM | POA: Diagnosis not present

## 2020-03-15 DIAGNOSIS — Z01419 Encounter for gynecological examination (general) (routine) without abnormal findings: Secondary | ICD-10-CM | POA: Diagnosis not present

## 2020-03-15 DIAGNOSIS — R87610 Atypical squamous cells of undetermined significance on cytologic smear of cervix (ASC-US): Secondary | ICD-10-CM | POA: Insufficient documentation

## 2020-03-15 NOTE — Progress Notes (Signed)
GYNECOLOGY ANNUAL PREVENTATIVE CARE ENCOUNTER NOTE  History:     Rachel Swanson is a 44 y.o. [id number] female here for a routine annual gynecologic exam.  Current complaints: desires annual STI screen.  Also wants to discuss possible contraception, uses condoms but wants information about other options.   Denies abnormal vaginal bleeding, discharge, pelvic pain, problems with intercourse or other gynecologic concerns.    Gynecologic History Patient's last menstrual period was 03/08/2020 (exact date). Contraception: condoms Last Pap: 02/2019. Results were: normal with negative HPV Last mammogram: 05/05/2019. Results were: normal  Obstetric History OB History  Gravida Para Term Preterm AB Living  1       1    SAB TAB Ectopic Multiple Live Births  1            # Outcome Date GA Lbr Len/2nd Weight Sex Delivery Anes PTL Lv  1 SAB             Past Medical History:  Diagnosis Date  . GERD (gastroesophageal reflux disease)   . Hypertension   . Prediabetes   . Seasonal allergies     Past Surgical History:  Procedure Laterality Date  . BUNIONECTOMY Right   . WISDOM TOOTH EXTRACTION      Current Outpatient Medications on File Prior to Visit  Medication Sig Dispense Refill  . amLODipine (NORVASC) 10 MG tablet TAKE 1 TABLET BY MOUTH EVERY DAY FOR BLOOD PRESSURE 90 tablet 1  . diclofenac Sodium (VOLTAREN) 1 % GEL Apply 2 g topically 4 (four) times daily. 50 g 11  . fluticasone (FLONASE) 50 MCG/ACT nasal spray PLACE 1 SPRAY INTO BOTH NOSTRILS 2 (TWO) TIMES DAILY 48 mL 1  . hydrochlorothiazide (HYDRODIURIL) 25 MG tablet TAKE 1 TABLET BY MOUTH EVERY DAY 90 tablet 2  . montelukast (SINGULAIR) 10 MG tablet TAKE 1 TABLET BY MOUTH EVERYDAY AT BEDTIME 90 tablet 1  . omeprazole (PRILOSEC) 20 MG capsule TAKE 1 CAPSULE (20 MG TOTAL) BY MOUTH DAILY. FOR HEARTBURN. 90 capsule 3   No current facility-administered medications on file prior to visit.    Allergies  Allergen Reactions  . Ace  Inhibitors Cough    Social History:  reports that she has never smoked. She has never used smokeless tobacco. She reports current alcohol use. She reports that she does not use drugs.  Family History  Problem Relation Age of Onset  . Heart disease Mother   . Hypertension Mother   . Hyperlipidemia Father   . Heart attack Father   . Diabetes Sister   . Breast cancer Paternal Aunt   . Breast cancer Cousin        paternal  . Colon cancer Neg Hx   . Esophageal cancer Neg Hx   . Rectal cancer Neg Hx   . Stomach cancer Neg Hx     The following portions of the patient's history were reviewed and updated as appropriate: allergies, current medications, past family history, past medical history, past social history, past surgical history and problem list.  Review of Systems Pertinent items noted in HPI and remainder of comprehensive ROS otherwise negative.  Physical Exam:  BP 135/88   Pulse 76   Ht 5\' 5"  (1.651 m)   Wt 259 lb (117.5 kg)   LMP 03/08/2020 (Exact Date)   BMI 43.10 kg/m  CONSTITUTIONAL: Well-developed, well-nourished female in no acute distress.  HENT:  Normocephalic, atraumatic, External right and left ear normal. Oropharynx is clear and moist EYES: Conjunctivae and EOM  are normal. Pupils are equal, round, and reactive to light. No scleral icterus.  NECK: Normal range of motion, supple, no masses.  Normal thyroid.  SKIN: Skin is warm and dry. No rash noted. Not diaphoretic. No erythema. No pallor. MUSCULOSKELETAL: Normal range of motion. No tenderness.  No cyanosis, clubbing, or edema.   NEUROLOGIC: Alert and oriented to person, place, and time. Normal reflexes, muscle tone coordination. No cranial nerve deficit noted. PSYCHIATRIC: Normal mood and affect. Normal behavior. Normal judgment and thought content. CARDIOVASCULAR: Normal heart rate noted, regular rhythm RESPIRATORY: Clear to auscultation bilaterally. Effort and breath sounds normal, no problems with respiration  noted. BREASTS: Symmetric in size. No masses, tenderness, skin changes, nipple drainage, or lymphadenopathy bilaterally.  Performed in the presence of a chaperone. ABDOMEN: Soft, obese, no distention appreciated.  No tenderness, rebound or guarding.  PELVIC: Normal appearing external genitalia and urethral meatus; normal appearing vaginal mucosa and cervix.  Normal appearing discharge.  Pap smear obtained.  Unable to palpate uterus or adnexa secondary to habitus.  Performed in the presence of a chaperone.   Assessment and Plan:      1. Encounter for counseling regarding contraception Reviewed all forms of birth control options available including abstinence; fertility period awareness methods; over the counter/barrier methods; hormonal contraceptive medication including pill, patch, ring, injection,contraceptive implant; hormonal and nonhormonal IUDs; permanent sterilization options including vasectomy and the various tubal sterilization modalities. Risks and benefits reviewed.  Questions were answered. Leaning towards IUD, information was given to patient to review.   2. Well woman exam with routine gynecological exam - Hepatitis B surface antigen - Hepatitis C antibody - HIV Antibody (routine testing w rflx) - RPR - Cytology - PAP Will follow up results of pap smear and manage accordingly. Mammogram scheduled already. Routine preventative health maintenance measures emphasized. Please refer to After Visit Summary for other counseling recommendations.      Verita Schneiders, MD, [place] for [facility], [place]

## 2020-03-15 NOTE — Patient Instructions (Addendum)
Preventive Care 40-46 Years Old, Female Preventive care refers to visits with your health care provider and lifestyle choices that can promote health and wellness. This includes:  A yearly physical exam. This may also be called an annual well check.  Regular dental visits and eye exams.  Immunizations.  Screening for certain conditions.  Healthy lifestyle choices, such as eating a healthy diet, getting regular exercise, not using drugs or products that contain nicotine and tobacco, and limiting alcohol use. What can I expect for my preventive care visit? Physical exam Your health care provider will check your:  Height and weight. This may be used to calculate body mass index (BMI), which tells if you are at a healthy weight.  Heart rate and blood pressure.  Skin for abnormal spots. Counseling Your health care provider may ask you questions about your:  Alcohol, tobacco, and drug use.  Emotional well-being.  Home and relationship well-being.  Sexual activity.  Eating habits.  Work and work environment.  Method of birth control.  Menstrual cycle.  Pregnancy history. What immunizations do I need?  Influenza (flu) vaccine  This is recommended every year. Tetanus, diphtheria, and pertussis (Tdap) vaccine  You may need a Td booster every 10 years. Varicella (chickenpox) vaccine  You may need this if you have not been vaccinated. Zoster (shingles) vaccine  You may need this after age 60. Measles, mumps, and rubella (MMR) vaccine  You may need at least one dose of MMR if you were born in 1957 or later. You may also need a second dose. Pneumococcal conjugate (PCV13) vaccine  You may need this if you have certain conditions and were not previously vaccinated. Pneumococcal polysaccharide (PPSV23) vaccine  You may need one or two doses if you smoke cigarettes or if you have certain conditions. Meningococcal conjugate (MenACWY) vaccine  You may need this if you  have certain conditions. Hepatitis A vaccine  You may need this if you have certain conditions or if you travel or work in places where you may be exposed to hepatitis A. Hepatitis B vaccine  You may need this if you have certain conditions or if you travel or work in places where you may be exposed to hepatitis B. Haemophilus influenzae type b (Hib) vaccine  You may need this if you have certain conditions. Human papillomavirus (HPV) vaccine  If recommended by your health care provider, you may need three doses over 6 months. You may receive vaccines as individual doses or as more than one vaccine together in one shot (combination vaccines). Talk with your health care provider about the risks and benefits of combination vaccines. What tests do I need? Blood tests  Lipid and cholesterol levels. These may be checked every 5 years, or more frequently if you are over 50 years old.  Hepatitis C test.  Hepatitis B test. Screening  Lung cancer screening. You may have this screening every year starting at age 55 if you have a 30-pack-year history of smoking and currently smoke or have quit within the past 15 years.  Colorectal cancer screening. All adults should have this screening starting at age 50 and continuing until age 75. Your health care provider may recommend screening at age 45 if you are at increased risk. You will have tests every 1-10 years, depending on your results and the type of screening test.  Diabetes screening. This is done by checking your blood sugar (glucose) after you have not eaten for a while (fasting). You may have this   done every 1-3 years.  Mammogram. This may be done every 1-2 years. Talk with your health care provider about when you should start having regular mammograms. This may depend on whether you have a family history of breast cancer.  BRCA-related cancer screening. This may be done if you have a family history of breast, ovarian, tubal, or peritoneal  cancers.  Pelvic exam and Pap test. This may be done every 3 years starting at age 80. Starting at age 45, this may be done every 5 years if you have a Pap test in combination with an HPV test. Other tests  Sexually transmitted disease (STD) testing.  Bone density scan. This is done to screen for osteoporosis. You may have this scan if you are at high risk for osteoporosis. Follow these instructions at home: Eating and drinking  Eat a diet that includes fresh fruits and vegetables, whole grains, lean protein, and low-fat dairy.  Take vitamin and mineral supplements as recommended by your health care provider.  Do not drink alcohol if: ? Your health care provider tells you not to drink. ? You are pregnant, may be pregnant, or are planning to become pregnant.  If you drink alcohol: ? Limit how much you have to 0-1 drink a day. ? Be aware of how much alcohol is in your drink. In the [place] one drink equals one 12 oz bottle of beer (355 mL), one 5 oz glass of wine (148 mL), or one 1 oz glass of hard liquor (44 mL). Lifestyle  Take daily care of your teeth and gums.  Stay active. Exercise for at least 30 minutes on 5 or more days each week.  Do not use any products that contain nicotine or tobacco, such as cigarettes, e-cigarettes, and chewing tobacco. If you need help quitting, ask your health care provider.  If you are sexually active, practice safe sex. Use a condom or other form of birth control (contraception) in order to prevent pregnancy and STIs (sexually transmitted infections).  If told by your health care provider, take low-dose aspirin daily starting at age 73. What's next?  Visit your health care provider once a year for a well check visit.  Ask your health care provider how often you should have your eyes and teeth checked.  Stay up to date on all vaccines. This information is not intended to replace advice given to you by your health care provider. Make sure you  discuss any questions you have with your health care provider. Document Revised: 05/23/2018 Document Reviewed: 05/23/2018 Elsevier Patient Education  [place].   Intrauterine Device Information An intrauterine device (IUD) is a medical device that is inserted in the uterus to prevent pregnancy. It is a small, T-shaped device that has one or two nylon strings hanging down from it. The strings hang out of the lower part of the uterus (cervix) to allow for future IUD removal. There are two types of IUDs available:  Hormone IUD. This type of IUD is made of plastic and contains the hormone progestin (synthetic progesterone). A hormone IUD may last 5-7 years.  Copper IUD. This type of IUD has copper wire wrapped around it. A copper IUD may last up to 10 years. How is an IUD inserted? An IUD is inserted through the vagina and placed into the uterus with a minor medical procedure. The exact procedure for IUD insertion may vary among health care providers and hospitals. How does an IUD work? Synthetic progesterone in a hormonal IUD  prevents pregnancy by:  Thickening cervical mucus to prevent sperm from entering the uterus.  Thinning the uterine lining to prevent a fertilized egg from being implanted there. Copper in a copper IUD prevents pregnancy by making the uterus and fallopian tubes produce a fluid that kills sperm. What are the advantages of an IUD? Advantages of either type of IUD  It is highly effective in preventing pregnancy.  It is reversible. You can become pregnant shortly after the IUD is removed.  It is low-maintenance and can stay in place for a long time.  There are no estrogen-related side effects.  It can be used when breastfeeding.  It is not associated with weight gain.  It can be inserted right after childbirth, an abortion, or a miscarriage. Advantages of a hormone IUD  If it is inserted within 7 days of your period starting, it works right after it is  inserted. If the hormone IUD is inserted at any other time in your cycle, you will need to use a backup method of birth control for 7 days after insertion.  It can make menstrual periods lighter.  It can reduce menstrual cramping.  It can be used for 3-5 years. Advantages of a copper IUD  It works right after it is inserted.  It can be used as a form of emergency birth control if it is inserted within 5 days after having unprotected sex.  It does not interfere with your body's natural hormones.  It can be used for 10 years. What are the disadvantages of an IUD?  An IUD may cause irregular menstrual bleeding for a period of time after insertion.  You may have pain during insertion and have cramping and vaginal bleeding after insertion.  An IUD may cut the uterus (uterine perforation) when it is inserted. This is rare.  An IUD may cause pelvic inflammatory disease (PID), which is an infection in the uterus and fallopian tubes. This is rare, and it usually happens during the first 20 days after the IUD is inserted.  A copper IUD can make your menstrual flow heavier and more painful. How is an IUD removed?  You will lie on your back with your knees bent and your feet in footrests (stirrups).  A device will be inserted into your vagina to spread apart the vaginal walls (speculum). This will allow your health care provider to see the strings attached to the IUD.  Your health care provider will use a small instrument (forceps) to grasp the IUD strings and pull firmly until the IUD is removed. You may have some discomfort when the IUD is removed. Your health care provider may recommend taking over-the-counter pain relievers, such as ibuprofen, before the procedure. You may also have minor spotting for a few days after the procedure. The exact procedure for IUD removal may vary among health care providers and hospitals. Is the IUD right for me? Your health care provider will make sure you  are a good candidate for an IUD and will discuss the advantages, disadvantages, and possible side effects with you. Summary  An intrauterine device (IUD) is a medical device that is inserted in the uterus to prevent pregnancy. It is a small, T-shaped device that has one or two nylon strings hanging down from it.  A hormone IUD contains the hormone progestin (synthetic progesterone). A copper IUD has copper wire wrapped around it.  Synthetic progesterone in a hormone IUD prevents pregnancy by thickening cervical mucus and thinning the walls of the  uterus. Copper in a copper IUD prevents pregnancy by making the uterus and fallopian tubes produce a fluid that kills sperm.  A hormone IUD can be left in place for 5-7 years. A copper IUD can be left in place for up to 10 years.  An IUD is inserted and removed by a health care provider. You may feel some pain during insertion and removal. Your health care provider may recommend taking over-the-counter pain medicine, such as ibuprofen, before an IUD procedure. This information is not intended to replace advice given to you by your health care provider. Make sure you discuss any questions you have with your health care provider. Document Revised: 08/24/2017 Document Reviewed: 10/10/2016 Elsevier Patient Education  [place].

## 2020-03-16 LAB — HEPATITIS C ANTIBODY: Hep C Virus Ab: <0.1 s/co ratio (ref 0.0–0.9)

## 2020-03-16 LAB — RPR: RPR Ser Ql: NONREACTIVE

## 2020-03-16 LAB — SPECIMEN STATUS REPORT

## 2020-03-16 LAB — HEPATITIS B SURFACE ANTIGEN: Hepatitis B Surface Ag: NEGATIVE

## 2020-03-16 LAB — HIV ANTIBODY (ROUTINE TESTING W REFLEX): HIV Screen 4th Generation wRfx: NONREACTIVE

## 2020-03-18 ENCOUNTER — Other Ambulatory Visit: Payer: Self-pay | Admitting: Primary Care

## 2020-03-18 DIAGNOSIS — J302 Other seasonal allergic rhinitis: Secondary | ICD-10-CM

## 2020-03-18 LAB — CYTOLOGY - PAP
Chlamydia: NEGATIVE
Comment: NEGATIVE
Comment: NEGATIVE
Comment: NEGATIVE
Comment: NORMAL
Diagnosis: UNDETERMINED — AB
High risk HPV: NEGATIVE
Neisseria Gonorrhea: NEGATIVE
Trichomonas: NEGATIVE

## 2020-03-19 ENCOUNTER — Encounter: Payer: Self-pay | Admitting: Obstetrics & Gynecology

## 2020-04-23 ENCOUNTER — Other Ambulatory Visit: Payer: Self-pay | Admitting: Primary Care

## 2020-04-23 DIAGNOSIS — J3089 Other allergic rhinitis: Secondary | ICD-10-CM

## 2020-05-05 ENCOUNTER — Other Ambulatory Visit: Payer: Self-pay

## 2020-05-05 ENCOUNTER — Ambulatory Visit
Admission: RE | Admit: 2020-05-05 | Discharge: 2020-05-05 | Disposition: A | Discharge status: Home / Self Care | Payer: BC Managed Care – PPO | Source: Ambulatory Visit | Attending: Primary Care | Admitting: Primary Care

## 2020-05-05 DIAGNOSIS — Z1231 Encounter for screening mammogram for malignant neoplasm of breast: Secondary | ICD-10-CM

## 2020-05-06 DIAGNOSIS — I10 Essential (primary) hypertension: Secondary | ICD-10-CM

## 2020-05-07 MED ORDER — AMLODIPINE BESYLATE 10 MG PO TABS: 10.0000 mg | ORAL_TABLET | Freq: Every day | ORAL | 1 refills | Status: DC

## 2020-05-07 NOTE — Telephone Encounter (Signed)
Last fill 09/03/19, #90, 1RF Last OV 12/16/19 ' Sent in script

## 2020-08-18 ENCOUNTER — Other Ambulatory Visit: Payer: Self-pay

## 2020-08-18 ENCOUNTER — Telehealth: Payer: Self-pay

## 2020-08-18 DIAGNOSIS — I10 Essential (primary) hypertension: Secondary | ICD-10-CM

## 2020-08-18 MED ORDER — AMLODIPINE BESYLATE 10 MG PO TABS: 10.0000 mg | ORAL_TABLET | Freq: Every day | ORAL | 0 refills | Status: DC

## 2020-08-18 MED ORDER — HYDROCHLOROTHIAZIDE 25 MG PO TABS: 25.0000 mg | ORAL_TABLET | Freq: Every day | ORAL | 0 refills | Status: DC

## 2020-08-18 NOTE — Telephone Encounter (Signed)
See order note from today.

## 2020-08-18 NOTE — Telephone Encounter (Signed)
[facility] - Client TELEPHONE ADVICE RECORD [facility] Patient Name: DANIELYS MADRY Gender: Female DOB: February 24, 1976 Age: 44 Y 10 M 13 D Return Phone Number: [phone] (Primary) Address: City/State/Zip: [place] [facility] - Client Client Site [facility] - Day Physician Alma Friendly - NP Contact Type Call Who Is Calling Patient / Member / Family / Caregiver Call Type Triage / Clinical Relationship To Patient Self Return Phone Number [phone] (Primary) Chief Complaint Dizziness Reason for Call Symptomatic / Request for [place] states is Raquel Sarna w/[facility]. Pt is currently out of town, lost her medication bag, told by [facility] to call to get [facility] prescription. Pt is lightheaded, severe headache and blood pressure is elevated. Translation No Nurse Assessment Nurse: Raenette Rover, RN, Zella Ball Date/Time (Eastern Time): 08/18/2020 1:53:40 PM Confirm and document reason for call. If symptomatic, describe symptoms. ---Pt is lightheaded, severe headache and blood pressure is elevated. Amlodipine 10 mg daily, Hydrochlorothiazide 25 mg daily, Heart burn med prilosec? 25 mg daily, monculstat 10 mg daily. Does the patient have any new or worsening symptoms? ---Yes Will a triage be completed? ---Yes Related visit to physician within the last 2 weeks? ---No Does the PT have any chronic conditions? (i.e. diabetes, asthma, this includes High risk factors for pregnancy, etc.) ---Yes List chronic conditions. ---htn Is the patient pregnant or possibly pregnant? (Ask all females between the ages of 69-55) ---No Is this a behavioral health or substance abuse call? ---No Guidelines Guideline Title Affirmed Question Affirmed Notes Nurse Date/Time (Eastern Time) Blood Pressure - High Ran out of BP medications Raenette Rover, RN, Zella Ball 08/18/2020 1:56:02 PM Disp. Time Eilene Ghazi Time)  Disposition Final User 08/18/2020 2:00:40 PM Call PCP within 24 Hours Yes Raenette Rover, RN, Zella Ball PLEASE NOTE: All timestamps contained within this report are represented as [place] Standard Time. CONFIDENTIALTY NOTICE: This fax transmission is intended only for the addressee. It contains information that is legally privileged, confidential or otherwise protected from use or disclosure. If you are not the intended recipient, you are strictly prohibited from reviewing, disclosing, copying using or disseminating any of this information or taking any action in reliance on or regarding this information. If you have received this fax in error, please notify us immediately by telephone so that we can arrange for its return to [place]. Phone: [phone], Toll-Free: [phone], Fax: [phone] Page: 2 of 2 Call Id: [id number] [place] Disagree/Comply Comply Caller Understands Yes PreDisposition Did not know what to do Care Advice Given Per Guideline CALL PCP WITHIN 24 HOURS: CARE ADVICE given per High Blood Pressure (Adult) guideline. Comments User: Wilson Singer, RN Date/Time Eilene Ghazi Time): 08/18/2020 2:00:02 PM [facility] [place] [phone] User: Wilson Singer, RN Date/Time Eilene Ghazi Time): 08/18/2020 2:04:03 PM Called back line to assist in getting prescriptions for meds. User: Wilson Singer, RN Date/Time Eilene Ghazi Time): 08/18/2020 2:06:42 PM Office advised then will call the paitent back.

## 2020-08-20 NOTE — Telephone Encounter (Signed)
Called and spoke to patient on 11/24. BP meds were called in per Anda Kraft. She will get rest of medications over the counter. She will call If any questions.

## 2020-09-05 ENCOUNTER — Other Ambulatory Visit: Payer: Self-pay | Admitting: Primary Care

## 2020-09-05 DIAGNOSIS — J302 Other seasonal allergic rhinitis: Secondary | ICD-10-CM

## 2020-10-23 ENCOUNTER — Telehealth: Payer: Self-pay | Admitting: Primary Care

## 2020-10-23 DIAGNOSIS — J3089 Other allergic rhinitis: Secondary | ICD-10-CM

## 2020-10-25 NOTE — Telephone Encounter (Signed)
Left message to return call to our office.  Was due for CPE 12/22 called to set up.

## 2020-10-27 NOTE — Telephone Encounter (Signed)
Patient called back . She is scheduled for tom 10/28/20 to come in for the appt. EM

## 2020-10-27 NOTE — Telephone Encounter (Signed)
Called patient to make appointment. L/m to call office will call in 30 day supply with note to call of follow up.

## 2020-10-28 ENCOUNTER — Other Ambulatory Visit: Payer: Self-pay | Admitting: Primary Care

## 2020-10-28 ENCOUNTER — Encounter: Payer: Self-pay | Admitting: Primary Care

## 2020-10-28 ENCOUNTER — Other Ambulatory Visit: Payer: Self-pay

## 2020-10-28 ENCOUNTER — Ambulatory Visit: Payer: BC Managed Care – PPO | Admitting: Primary Care

## 2020-10-28 VITALS — BP 130/82 | HR 75 | Temp 97.9°F | Ht 64.5 in | Wt 266.0 lb

## 2020-10-28 DIAGNOSIS — R053 Chronic cough: Secondary | ICD-10-CM | POA: Diagnosis not present

## 2020-10-28 DIAGNOSIS — R7303 Prediabetes: Secondary | ICD-10-CM | POA: Diagnosis not present

## 2020-10-28 DIAGNOSIS — I10 Essential (primary) hypertension: Secondary | ICD-10-CM | POA: Diagnosis not present

## 2020-10-28 DIAGNOSIS — K219 Gastro-esophageal reflux disease without esophagitis: Secondary | ICD-10-CM | POA: Diagnosis not present

## 2020-10-28 DIAGNOSIS — J302 Other seasonal allergic rhinitis: Secondary | ICD-10-CM | POA: Diagnosis not present

## 2020-10-28 DIAGNOSIS — R87610 Atypical squamous cells of undetermined significance on cytologic smear of cervix (ASC-US): Secondary | ICD-10-CM

## 2020-10-28 DIAGNOSIS — E876 Hypokalemia: Secondary | ICD-10-CM

## 2020-10-28 DIAGNOSIS — J3089 Other allergic rhinitis: Secondary | ICD-10-CM | POA: Diagnosis not present

## 2020-10-28 LAB — COMPREHENSIVE METABOLIC PANEL
ALT: 21 U/L (ref 0–35)
AST: 22 U/L (ref 0–37)
Albumin: 3.7 g/dL (ref 3.5–5.2)
Alkaline Phosphatase: 60 U/L (ref 39–117)
BUN: 10 mg/dL (ref 6–23)
CO2: 33 mEq/L — ABNORMAL HIGH (ref 19–32)
Calcium: 9.8 mg/dL (ref 8.4–10.5)
Chloride: 101 mEq/L (ref 96–112)
Creatinine, Ser: 0.83 mg/dL (ref 0.40–1.20)
GFR: 84.16 mL/min (ref >60.00)
Glucose, Bld: 105 mg/dL — ABNORMAL HIGH (ref 70–99)
Potassium: 3.1 mEq/L — ABNORMAL LOW (ref 3.5–5.1)
Sodium: 140 mEq/L (ref 135–145)
Total Bilirubin: 0.7 mg/dL (ref 0.2–1.2)
Total Protein: 7 g/dL (ref 6.0–8.3)

## 2020-10-28 LAB — CBC
HCT: 40.7 % (ref 36.0–46.0)
Hemoglobin: 13.6 g/dL (ref 12.0–15.0)
MCHC: 33.4 g/dL (ref 30.0–36.0)
MCV: 88.7 fl (ref 78.0–100.0)
Platelets: 325 10*3/uL (ref 150.0–400.0)
RBC: 4.59 Mil/uL (ref 3.87–5.11)
RDW: 13.9 % (ref 11.5–15.5)
WBC: 4.4 10*3/uL (ref 4.0–10.5)

## 2020-10-28 LAB — LIPID PANEL
Cholesterol: 165 mg/dL (ref 0–200)
HDL: 51.8 mg/dL (ref >39.00)
LDL Cholesterol: 98 mg/dL (ref 0–99)
NonHDL: 113.32
Total CHOL/HDL Ratio: 3
Triglycerides: 78 mg/dL (ref 0.0–149.0)
VLDL: 15.6 mg/dL (ref 0.0–40.0)

## 2020-10-28 LAB — HEMOGLOBIN A1C: Hgb A1c MFr Bld: 5.8 % (ref 4.6–6.5)

## 2020-10-28 LAB — TSH: TSH: 1.58 u[IU]/mL (ref 0.35–4.50)

## 2020-10-28 MED ORDER — ALBUTEROL SULFATE HFA 108 (90 BASE) MCG/ACT IN AERS: 2.0000 | INHALATION_SPRAY | Freq: Four times a day (QID) | RESPIRATORY_TRACT | 0 refills | Status: DC | PRN

## 2020-10-28 MED ORDER — LEVOCETIRIZINE DIHYDROCHLORIDE 5 MG PO TABS
5.0000 mg | ORAL_TABLET | Freq: Every evening | ORAL | 0 refills | Status: DC
Start: 2020-10-28 — End: 2021-01-24

## 2020-10-28 MED ORDER — OMEPRAZOLE 20 MG PO CPDR: 20.0000 mg | DELAYED_RELEASE_CAPSULE | Freq: Every day | ORAL | 3 refills | Status: DC

## 2020-10-28 MED ORDER — HYDROCHLOROTHIAZIDE 25 MG PO TABS: 25.0000 mg | ORAL_TABLET | Freq: Every day | ORAL | 3 refills | Status: DC

## 2020-10-28 MED ORDER — FLUTICASONE PROPIONATE 50 MCG/ACT NA SUSP: 1.0000 | Freq: Two times a day (BID) | NASAL | 3 refills | Status: DC

## 2020-10-28 MED ORDER — AMLODIPINE BESYLATE 10 MG PO TABS: 10.0000 mg | ORAL_TABLET | Freq: Every day | ORAL | 3 refills | Status: DC

## 2020-10-28 MED ORDER — POTASSIUM CHLORIDE CRYS ER 20 MEQ PO TBCR: 20.0000 meq | EXTENDED_RELEASE_TABLET | Freq: Two times a day (BID) | ORAL | 0 refills | Status: DC

## 2020-10-28 NOTE — Assessment & Plan Note (Signed)
No post nasal drip or other allergy symptoms except for cough which seems to be bothersome during Fall/Winter months.  Continue Singulair. Add Xyzal. Add albuterol to use PRN for coughing spells. Continue omeprazole.  If no improvement then consider PFT's.

## 2020-10-28 NOTE — Assessment & Plan Note (Signed)
Follows with GYN. Due in June 2022.

## 2020-10-28 NOTE — Patient Instructions (Signed)
Stop by the lab prior to leaving today. I will notify you of your results once received.   Start exercising. You should be getting 150 minutes of moderate intensity exercise weekly.  It's important to improve your diet by reducing consumption of fast food, fried food, processed snack foods, sugary drinks. Increase consumption of fresh vegetables and fruits, whole grains, water.  Ensure you are drinking 64 ounces of water daily.  Start levocetirizine (Xyzal) 5 mg at night for allergies.   Shortness of Breath/Wheezing/Cough: Use the albuterol inhaler. Inhale 2 puffs into the lungs every 4 to 6 hours as needed for wheezing, cough, and/or shortness of breath.  Only use if needed.  Please update me as discussed.  It was a pleasure to see you today!

## 2020-10-28 NOTE — Assessment & Plan Note (Signed)
Chronic, seems to be worse during Fall/Winter months. She denies a history of asthma. No wheezing on exam today.  Continue omeprazole 20 mg. Continue Singulair. Add Xyzal. Add albuterol PRN as this was beneficial for coughing fits years ago.  She will update, if no improvement then consider PFT's.

## 2020-10-28 NOTE — Assessment & Plan Note (Signed)
Borderline high today, but she admits to a poor diet and was not exercising before. She is motivated to improve her lifestyle.  Continue amlodipine 10 mg, continue HCTZ 25 mg. CMP pending. Refills provided.

## 2020-10-28 NOTE — Assessment & Plan Note (Signed)
Denies symptoms since on omeprazole 20 mg.  Continue same.

## 2020-10-28 NOTE — Assessment & Plan Note (Signed)
Discussed the importance of a healthy diet and regular exercise in order for weight loss, and to reduce the risk of any potential medical problems.  Repeat A1C pending. 

## 2020-10-28 NOTE — Progress Notes (Signed)
Subjective:    Patient ID: Rachel Swanson, female    DOB: Aug 05, 1976, 45 y.o.   MRN: [id number]  HPI  This visit occurred during the SARS-CoV-2 public health emergency.  Safety protocols were in place, including screening questions prior to the visit, additional usage of staff PPE, and extensive cleaning of exam room while observing appropriate contact time as indicated for disinfecting solutions.   Rachel Swanson is a 45 year old female with a history of hypertension, GERD, prediabetes, ASCUS with negative high risk HPV who presents today for follow up and to discuss her medications.  1) Essential Hypertension: Currently managed on amlodipine 10 mg, HCTZ 25 mg. She denies chest pain, headaches.   BP Readings from Last 3 Encounters:  10/28/20 130/82  03/15/20 135/88  12/16/19 122/80     2) Seasonal Allergies: Currently managed on Singulair 10 mg and Flonase daily, does  well on this regimen except for chronic cough. Also on omeprazole 20 mg for chronic cough, continues to cough. She's been taking Robitussin during the Christmas season up until about one week ago.   Her cough has improved over the last week, is not taking Robitussin everyday now, but has it in her purse. She denies a history of asthma,even as a child. She was given an albuterol years ago for persistent cough which helped. She does notice her cough increases during Fall/Winter months.   Her cough is mostly dry. She denies post nasal drip, sinus pressure, GERD.   Review of Systems  HENT: Negative for congestion, postnasal drip and sinus pressure.   Respiratory: Positive for cough. Negative for shortness of breath.   Gastrointestinal:       Denies GERD  Allergic/Immunologic: Positive for environmental allergies.       Past Medical History:  Diagnosis Date  . GERD (gastroesophageal reflux disease)   . Hypertension   . Prediabetes   . Seasonal allergies      Social History   Socioeconomic History  .  Marital status: Single    Spouse name: Not on file  . Number of children: 0  . Years of education: Not on file  . Highest education level: Not on file  Occupational History  . Occupation: Pharmacist, hospital  Tobacco Use  . Smoking status: Never Smoker  . Smokeless tobacco: Never Used  Vaping Use  . Vaping Use: Never used  Substance and Sexual Activity  . Alcohol use: Yes    Comment: 1-2 per month  . Drug use: No  . Sexual activity: Yes    Partners: Male    Birth control/protection: Condom  Other Topics Concern  . 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    Social Determinants of Health   Financial Resource Strain: Not on file  Food Insecurity: Not on file  Transportation Needs: Not on file  Physical Activity: Not on file  Stress: Not on file  Social Connections: Not on file  Intimate Partner Violence: Not on file    Past Surgical History:  Procedure Laterality Date  . BUNIONECTOMY Right   . WISDOM TOOTH EXTRACTION      Family History  Problem Relation Age of Onset  . Heart disease Mother   . Hypertension Mother   . Hyperlipidemia Father   . Heart attack Father   . Diabetes Sister   . Breast cancer Paternal Aunt   . Breast cancer Cousin  paternal  . Colon cancer Neg Hx   . Esophageal cancer Neg Hx   . Rectal cancer Neg Hx   . Stomach cancer Neg Hx     Allergies  Allergen Reactions  . Ace Inhibitors Cough    Current Outpatient Medications on File Prior to Visit  Medication Sig Dispense Refill  . diclofenac Sodium (VOLTAREN) 1 % GEL Apply 2 g topically 4 (four) times daily. 50 g 11  . montelukast (SINGULAIR) 10 MG tablet TAKE 1 TABLET BY MOUTH EVERYDAY AT BEDTIME 30 tablet 0   No current facility-administered medications on file prior to visit.    BP 130/82 (BP Location: Left Arm, Patient Position: Sitting, Cuff Size: Large)   Pulse 75   Temp 97.9 F (36.6 C) (Temporal)    Ht 5' 4.5" (1.638 m)   Wt 266 lb (120.7 kg)   LMP 10/08/2020   SpO2 96%   BMI 44.95 kg/m    Objective:   Physical Exam Constitutional:      Appearance: She is well-nourished.  Cardiovascular:     Rate and Rhythm: Normal rate and regular rhythm.  Pulmonary:     Effort: Pulmonary effort is normal.     Breath sounds: Normal breath sounds.  Musculoskeletal:     Cervical back: Neck supple.  Skin:    General: Skin is warm and dry.  Psychiatric:        Mood and Affect: Mood and affect and mood normal.            Assessment & Plan:

## 2020-11-01 ENCOUNTER — Telehealth: Payer: Self-pay | Admitting: Primary Care

## 2020-11-01 NOTE — Telephone Encounter (Signed)
Please see lab results for information.

## 2020-11-01 NOTE — Telephone Encounter (Signed)
Please return patients call. Thank you, EM

## 2020-11-09 DIAGNOSIS — E876 Hypokalemia: Secondary | ICD-10-CM

## 2020-11-13 ENCOUNTER — Other Ambulatory Visit: Payer: Self-pay | Admitting: Primary Care

## 2020-11-13 DIAGNOSIS — I10 Essential (primary) hypertension: Secondary | ICD-10-CM

## 2020-11-19 ENCOUNTER — Other Ambulatory Visit: Payer: Self-pay | Admitting: Primary Care

## 2020-11-19 DIAGNOSIS — J3089 Other allergic rhinitis: Secondary | ICD-10-CM

## 2020-11-30 MED ORDER — POTASSIUM CHLORIDE CRYS ER 20 MEQ PO TBCR
20.0000 meq | EXTENDED_RELEASE_TABLET | Freq: Every day | ORAL | 0 refills | Status: DC
Start: 2020-11-30 — End: 2021-02-17

## 2021-01-19 ENCOUNTER — Encounter: Payer: Self-pay | Admitting: Radiology

## 2021-01-23 ENCOUNTER — Other Ambulatory Visit: Payer: Self-pay | Admitting: Primary Care

## 2021-01-23 DIAGNOSIS — J3089 Other allergic rhinitis: Secondary | ICD-10-CM

## 2021-02-17 ENCOUNTER — Other Ambulatory Visit: Payer: Self-pay

## 2021-02-17 ENCOUNTER — Ambulatory Visit: Payer: BC Managed Care – PPO | Admitting: Primary Care

## 2021-02-17 ENCOUNTER — Encounter: Payer: Self-pay | Admitting: Primary Care

## 2021-02-17 VITALS — BP 136/90 | HR 78 | Temp 97.0°F | Ht 64.5 in | Wt 268.5 lb

## 2021-02-17 DIAGNOSIS — Z6841 Body Mass Index (BMI) 40.0 and over, adult: Secondary | ICD-10-CM

## 2021-02-17 DIAGNOSIS — I10 Essential (primary) hypertension: Secondary | ICD-10-CM

## 2021-02-17 MED ORDER — OLMESARTAN MEDOXOMIL 20 MG PO TABS: 20.0000 mg | ORAL_TABLET | Freq: Every day | ORAL | 0 refills | Status: DC

## 2021-02-17 NOTE — Assessment & Plan Note (Signed)
Chronic for years, has been obese her adult life.  Failed several attempts at weight loss.  I can support her decision for weight loss surgery. Will complete paperwork.

## 2021-02-17 NOTE — Patient Instructions (Signed)
Stop taking hydrochlorothiazide 25 mg for blood pressure. Remain off of potassium pills.  Start olmesartan 20 mg once daily for blood pressure. Continue amlodipine 10 mg daily for blood pressure.  Please schedule a follow up visit to meet back with me in 2-3 weeks for blood pressure check.   It was a pleasure to see you today!

## 2021-02-17 NOTE — Assessment & Plan Note (Signed)
Above goal today. She also decided to stop potassium as she believes this was contributing to lower extremity edema.  We discussed that in order to remain off of potassium she must come off of HCTZ as this is likely causing her hypokalemia.   We also discussed that amlodipine could be contributing to pedal edema.   We will discontinue HCTZ and potassium. Start olmesartan 20 mg daily. Continue amlodipine 10 mg for now.  Follow up in 2-3 weeks. Will check BMP at that time.

## 2021-02-17 NOTE — Progress Notes (Signed)
Subjective:    Patient ID: Rachel Swanson, female    DOB: [birth year], 45 y.o.   MRN: [id number]  HPI  Rachel Swanson is a very pleasant 45 y.o. female with a history of hypertension, GERD, prediabetes, chronic cough who presents today to discuss medication concerns.   1) Pedal Edema/Hypertension: She's noticed pedal and lower extremity edema beginning a few weeks ago. She is currently managed on HCTZ 25 mg and Amlodipine 10 mg for which she's taken for quite some time.  We began oral potassium chloride 20 mEq in February 2022 due to chronic hypokalemia that was suspected to be secondary to HCTZ. See result note from February, she decided to start oral potassium and remain on HCTZ.   She is checking her BP at home and doesn't recall exact readings, maybe 120's/80's?  Her edema is bothersome, especially towards the end of the day. Edema is improved in the morning when waking.  BP Readings from Last 3 Encounters:  02/17/21 136/90  10/28/20 130/82  03/15/20 135/88     2) Obesity: Chronic for years. She's been overweight for most of her adult life. She's tried Slim fast, low carb/no carb diets, low calorie diets, using the TriFit APP in the past without success. She attended a seminar at [facility], is considering the gastric sleeve.   She is needing a letter and some forms completed in order for her to continue.   Wt Readings from Last 3 Encounters:  02/17/21 268 lb 8 oz (121.8 kg)  10/28/20 266 lb (120.7 kg)  03/15/20 259 lb (117.5 kg)      Review of Systems  Respiratory: Negative for shortness of breath.   Cardiovascular: Positive for leg swelling. Negative for chest pain.  Neurological: Negative for dizziness.         Past Medical History:  Diagnosis Date  . GERD (gastroesophageal reflux disease)   . Hypertension   . Prediabetes   . Seasonal allergies     Social History   Socioeconomic History  . Marital status: Single    Spouse name: Not  on file  . Number of children: 0  . Years of education: Not on file  . Highest education level: Not on file  Occupational History  . Occupation: Pharmacist, hospital  Tobacco Use  . Smoking status: Never Smoker  . Smokeless tobacco: Never Used  Vaping Use  . Vaping Use: Never used  Substance and Sexual Activity  . Alcohol use: Yes    Comment: 1-2 per month  . Drug use: No  . Sexual activity: Yes    Partners: Male    Birth control/protection: Condom  Other Topics Concern  . 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    Social Determinants of Health   Financial Resource Strain: Not on file  Food Insecurity: Not on file  Transportation Needs: Not on file  Physical Activity: Not on file  Stress: Not on file  Social Connections: Not on file  Intimate Partner Violence: Not on file    Past Surgical History:  Procedure Laterality Date  . BUNIONECTOMY Right   . WISDOM TOOTH EXTRACTION      Family History  Problem Relation Age of Onset  . Heart disease Mother   . Hypertension Mother   . Hyperlipidemia Father   . Heart attack Father   . Diabetes Sister   . Breast cancer Paternal Aunt   .  Breast cancer Cousin        paternal  . Colon cancer Neg Hx   . Esophageal cancer Neg Hx   . Rectal cancer Neg Hx   . Stomach cancer Neg Hx     Allergies  Allergen Reactions  . Ace Inhibitors Cough    Current Outpatient Medications on File Prior to Visit  Medication Sig Dispense Refill  . albuterol (VENTOLIN HFA) 108 (90 Base) MCG/ACT inhaler Inhale 2 puffs into the lungs every 6 (six) hours as needed for wheezing or shortness of breath. 8 g 0  . amLODipine (NORVASC) 10 MG tablet TAKE 1 TABLET(10 MG) BY MOUTH DAILY 90 tablet 3  . diclofenac Sodium (VOLTAREN) 1 % GEL Apply 2 g topically 4 (four) times daily. 50 g 11  . fluticasone (FLONASE) 50 MCG/ACT nasal spray Place 1 spray into both nostrils 2 (two) times daily.  For allergies. 48 mL 3  . levocetirizine (XYZAL) 5 MG tablet TAKE 1 TABLET (5 MG TOTAL) BY MOUTH EVERY EVENING. FOR COUGH/ALLERGIES 90 tablet 2  . montelukast (SINGULAIR) 10 MG tablet TAKE 1 TABLET BY MOUTH EVERYDAY AT BEDTIME 90 tablet 1  . omeprazole (PRILOSEC) 20 MG capsule Take 1 capsule (20 mg total) by mouth daily. For heartburn. 90 capsule 3   No current facility-administered medications on file prior to visit.    BP 136/90 (BP Location: Left Arm, Patient Position: Sitting, Cuff Size: Large)   Pulse 78   Temp (!) 97 F (36.1 C) (Temporal)   Ht 5' 4.5" (1.638 m)   Wt 268 lb 8 oz (121.8 kg)   LMP 01/23/2021   SpO2 98%   BMI 45.38 kg/m  Objective:   Physical Exam Cardiovascular:     Rate and Rhythm: Normal rate and regular rhythm.  Pulmonary:     Effort: Pulmonary effort is normal.     Breath sounds: Normal breath sounds.  Musculoskeletal:     Cervical back: Neck supple.  Skin:    General: Skin is warm and dry.           Assessment & Plan:      This visit occurred during the SARS-CoV-2 public health emergency.  Safety protocols were in place, including screening questions prior to the visit, additional usage of staff PPE, and extensive cleaning of exam room while observing appropriate contact time as indicated for disinfecting solutions.

## 2021-02-18 ENCOUNTER — Encounter: Payer: Self-pay | Admitting: Primary Care

## 2021-02-26 ENCOUNTER — Other Ambulatory Visit: Payer: Self-pay | Admitting: Primary Care

## 2021-02-26 DIAGNOSIS — E876 Hypokalemia: Secondary | ICD-10-CM

## 2021-03-03 ENCOUNTER — Ambulatory Visit (INDEPENDENT_AMBULATORY_CARE_PROVIDER_SITE_OTHER): Payer: BC Managed Care – PPO | Admitting: Primary Care

## 2021-03-03 ENCOUNTER — Other Ambulatory Visit: Payer: Self-pay | Admitting: Primary Care

## 2021-03-03 ENCOUNTER — Encounter: Payer: Self-pay | Admitting: Primary Care

## 2021-03-03 ENCOUNTER — Other Ambulatory Visit: Payer: Self-pay

## 2021-03-03 DIAGNOSIS — I10 Essential (primary) hypertension: Secondary | ICD-10-CM

## 2021-03-03 LAB — BASIC METABOLIC PANEL
BUN: 9 mg/dL (ref 6–23)
CO2: 28 mEq/L (ref 19–32)
Calcium: 9.9 mg/dL (ref 8.4–10.5)
Chloride: 106 mEq/L (ref 96–112)
Creatinine, Ser: 0.89 mg/dL (ref 0.40–1.20)
GFR: 77.21 mL/min (ref >60.00)
Glucose, Bld: 91 mg/dL (ref 70–99)
Potassium: 3.7 mEq/L (ref 3.5–5.1)
Sodium: 140 mEq/L (ref 135–145)

## 2021-03-03 MED ORDER — OLMESARTAN MEDOXOMIL 20 MG PO TABS: 20.0000 mg | ORAL_TABLET | Freq: Every day | ORAL | 0 refills | Status: DC

## 2021-03-03 NOTE — Patient Instructions (Signed)
Stop by the lab prior to leaving today. I will notify you of your results once received.   Stop taking amlodipine for blood pressure, this could be causing ankle and leg swelling.  Continue taking olmesartan 20 mg (1 tablet) daily for now. Take 2 tablets of omlesartan daily if you notice your blood pressure increasing consistently at or above 130 on top or 90 on bottom.  It was a pleasure to see you today!

## 2021-03-03 NOTE — Assessment & Plan Note (Addendum)
BP well controlled today. Ankle/pedal edema has continued.  Stop amlodipine 10 mg. Continue olmesartan 20 mg. Increase olemesartan to 40 mg if BP at or above 130/90, discussed this today.  BMP pending. She will update.

## 2021-03-03 NOTE — Telephone Encounter (Signed)
Please notify patient that her insurance would only cover the olmesartan 20 mg to be taken once daily. I sent in 90 pills, so if she starts to see a rise in her BP as discussed then have her take 2 and update me. The Rx will have to read " take 1 tablet daily"

## 2021-03-03 NOTE — Progress Notes (Signed)
Subjective:    Patient ID: Rachel Swanson, female    DOB: 1976/08/08, 45 y.o.   MRN: [id number]  HPI  Rachel Swanson is a very pleasant 45 y.o. female with a history of hypertension, prediabetes, hypokalemia, chronic knee pain who presents today for follow up of hypertension.  She was last evaluated on 02/17/21, blood pressure was above goal, she also stopped her potassium chloride as she believed it was contributing to her lower extremity edema. Because she decided to discontinue her potassium, we stopped her HCTZ (thought to be contributing to hypokalemia), and started olmesartan 20 mg. We did discuss that her amlodipine could be contributing to pedal edema. She is here today for BP follow up.  Since her last visit she is compliant to her olmesartan 20 mg and amlodipine 10 mg. She continues to notice lower extremity swelling which is more so to the left lower extremity. She is elevating her legs when resting, is on her feet often.   BP Readings from Last 3 Encounters:  03/03/21 126/72  02/17/21 136/90  10/28/20 130/82      Review of Systems  Respiratory:  Negative for shortness of breath.   Cardiovascular:  Positive for leg swelling. Negative for chest pain.  Neurological:  Negative for headaches.        Past Medical History:  Diagnosis Date   GERD (gastroesophageal reflux disease)    Hypertension    Prediabetes    Seasonal allergies     Social History   Socioeconomic History   Marital status: Single    Spouse name: Not on file   Number of children: 0   Years of education: Not on file   Highest education level: Not on file  Occupational History   Occupation: Teacher  Tobacco Use   Smoking status: Never   Smokeless tobacco: Never  Vaping Use   Vaping Use: Never used  Substance and Sexual Activity   Alcohol use: Yes    Comment: 1-2 per month   Drug use: No   Sexual activity: Yes    Partners: Male    Birth control/protection: Condom  Other Topics  Concern   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    Social Determinants of Health   Financial Resource Strain: Not on file  Food Insecurity: Not on file  Transportation Needs: Not on file  Physical Activity: Not on file  Stress: Not on file  Social Connections: Not on file  Intimate Partner Violence: Not on file    Past Surgical History:  Procedure Laterality Date   BUNIONECTOMY Right    WISDOM TOOTH EXTRACTION      Family History  Problem Relation Age of Onset   Heart disease Mother    Hypertension Mother    Hyperlipidemia Father    Heart attack Father    Diabetes Sister    Breast cancer Paternal Aunt    Breast cancer Cousin        paternal   Colon cancer Neg Hx    Esophageal cancer Neg Hx    Rectal cancer Neg Hx    Stomach cancer Neg Hx     Allergies  Allergen Reactions   Ace Inhibitors Cough    Current Outpatient Medications on File Prior to Visit  Medication Sig Dispense Refill   albuterol (VENTOLIN HFA) 108 (90 Base) MCG/ACT inhaler Inhale 2 puffs into the lungs every 6 (six) hours  as needed for wheezing or shortness of breath. 8 g 0   diclofenac Sodium (VOLTAREN) 1 % GEL Apply 2 g topically 4 (four) times daily. 50 g 11   fluticasone (FLONASE) 50 MCG/ACT nasal spray Place 1 spray into both nostrils 2 (two) times daily. For allergies. 48 mL 3   levocetirizine (XYZAL) 5 MG tablet TAKE 1 TABLET (5 MG TOTAL) BY MOUTH EVERY EVENING. FOR COUGH/ALLERGIES 90 tablet 2   montelukast (SINGULAIR) 10 MG tablet TAKE 1 TABLET BY MOUTH EVERYDAY AT BEDTIME 90 tablet 1   olmesartan (BENICAR) 20 MG tablet Take 1 tablet (20 mg total) by mouth daily. For blood pressure. 30 tablet 0   omeprazole (PRILOSEC) 20 MG capsule Take 1 capsule (20 mg total) by mouth daily. For heartburn. 90 capsule 3   No current facility-administered medications on file prior to visit.    BP 126/72   Pulse 82   Temp  (!) 96 F (35.6 C) (Temporal)   Ht 5' 4.5" (1.638 m)   Wt 266 lb (120.7 kg)   BMI 44.95 kg/m  Objective:   Physical Exam Cardiovascular:     Rate and Rhythm: Normal rate and regular rhythm.     Comments: Mild to moderate left ankle edema. Trace pitting to left lower extremity proximal to ankle.  Pulmonary:     Effort: Pulmonary effort is normal.     Breath sounds: Normal breath sounds.  Musculoskeletal:     Cervical back: Neck supple.  Skin:    General: Skin is warm and dry.          Assessment & Plan:      This visit occurred during the SARS-CoV-2 public health emergency.  Safety protocols were in place, including screening questions prior to the visit, additional usage of staff PPE, and extensive cleaning of exam room while observing appropriate contact time as indicated for disinfecting solutions.

## 2021-03-09 NOTE — Telephone Encounter (Signed)
Left message to return call to our office.  

## 2021-03-10 NOTE — Telephone Encounter (Signed)
Patient called back states she paid out of pocket for medications she will do as advised and if gets readings that are elevated she will take two.

## 2021-03-10 NOTE — Telephone Encounter (Signed)
Left message to return call to our office.  

## 2021-03-11 ENCOUNTER — Telehealth: Payer: Self-pay | Admitting: Primary Care

## 2021-03-11 NOTE — Telephone Encounter (Signed)
Called pt and left a VM asking for a call back.

## 2021-03-11 NOTE — Telephone Encounter (Signed)
Can you call her to find out what's going on?

## 2021-03-11 NOTE — Telephone Encounter (Signed)
In last 3 days pt states that her BP has been over 130/90. She took 2 benicar today, but 1 the other 2 days. Pt would like to know what she should do moving forward. Please advise as soon as you can.

## 2021-03-11 NOTE — Telephone Encounter (Signed)
Mrs. Nack wanted to know if Anda Kraft could call her about her BP and her medication for it. She stated that it was important

## 2021-03-11 NOTE — Telephone Encounter (Signed)
Noted, see my chart message. 

## 2021-03-14 DIAGNOSIS — I10 Essential (primary) hypertension: Secondary | ICD-10-CM

## 2021-03-15 MED ORDER — CARVEDILOL 3.125 MG PO TABS: 3.1250 mg | ORAL_TABLET | Freq: Two times a day (BID) | ORAL | 0 refills | Status: DC

## 2021-04-06 ENCOUNTER — Other Ambulatory Visit: Payer: Self-pay | Admitting: Primary Care

## 2021-04-06 DIAGNOSIS — I10 Essential (primary) hypertension: Secondary | ICD-10-CM

## 2021-04-06 NOTE — Telephone Encounter (Signed)
Will you call patient to find out how blood pressure is running on the carvedilol 3.125 mg twice daily, and olmesartan 20 mg (2 tablets) daily?  Her medication list still reflects olmesartan 20 mg once daily, however during my chart discussions we increase to 2 tablets (40 mg) and added carvedilol 3.125 mg twice daily.

## 2021-04-07 NOTE — Telephone Encounter (Signed)
Patient is taking Olmesartan 20mg  two tab in the morning with a Carvedilol and a Carvedilol in the the evening. States her blood pressures are still average 140/94 she has had some higher but none that have been under 130/90. Patient informed that you are out of office and will not return until next week.

## 2021-04-21 ENCOUNTER — Other Ambulatory Visit: Payer: Self-pay | Admitting: Primary Care

## 2021-04-21 DIAGNOSIS — Z1231 Encounter for screening mammogram for malignant neoplasm of breast: Secondary | ICD-10-CM

## 2021-04-28 ENCOUNTER — Ambulatory Visit: Payer: BC Managed Care – PPO | Admitting: Obstetrics & Gynecology

## 2021-04-30 ENCOUNTER — Other Ambulatory Visit: Payer: Self-pay | Admitting: Primary Care

## 2021-04-30 DIAGNOSIS — I10 Essential (primary) hypertension: Secondary | ICD-10-CM

## 2021-04-30 MED ORDER — CARVEDILOL 3.125 MG PO TABS
3.1250 mg | ORAL_TABLET | Freq: Two times a day (BID) | ORAL | 0 refills | Status: DC
Start: 2021-04-30 — End: 2021-05-19

## 2021-05-02 ENCOUNTER — Other Ambulatory Visit: Payer: Self-pay | Admitting: Primary Care

## 2021-05-02 ENCOUNTER — Other Ambulatory Visit: Payer: Self-pay

## 2021-05-02 DIAGNOSIS — I10 Essential (primary) hypertension: Secondary | ICD-10-CM

## 2021-05-02 DIAGNOSIS — E876 Hypokalemia: Secondary | ICD-10-CM

## 2021-05-02 MED ORDER — OLMESARTAN MEDOXOMIL 40 MG PO TABS: 40.0000 mg | ORAL_TABLET | Freq: Every day | ORAL | 0 refills | Status: DC

## 2021-05-02 NOTE — Telephone Encounter (Signed)
See refill request. I have spoke to patient reviewed information. No further action needed at this time.

## 2021-05-02 NOTE — Telephone Encounter (Signed)
[facility] [facility] - Client TELEPHONE ADVICE RECORD [facility] Patient Name: Rachel Swanson NS-Giller Gender: Female DOB: [birth year] Age: 45 Y 33 M 26 D Return Phone Number: [phone] (Primary) Address: City/ State/ Zip: [place] [address] Client [place] [facility] - Client Client Site [place] Physician Alma Friendly - NP Contact Type Call Who Is Calling Patient / Member / Family / Caregiver Call Type Triage / Clinical Relationship To Patient Self Return Phone Number [phone] (Primary) Chief Complaint Prescription Refill or Medication Request (non symptomatic) Reason for Call Symptomatic / Request for [place] states she only has one pill left and needs a prescription. Translation No Nurse Assessment Nurse: Loletha Carrow, RN, Ronalee Belts Date/Time (Eastern Time): 04/30/2021 12:28:15 PM Please select the assessment type ---Refill Additional Documentation ---Caller states she only has one pill left and needs a prescription refill. Rx is for OLMESARTAN/ MEDOXOMIL '20MG'$  (2) po Q daily written (1) Qdaily rx# [id number] [facility] [phone] Does the patient have enough medication to last until the office opens? ---No Additional Documentation ---following directive to call in ennough toi get through monday morning (3) pills she has 1 left now no missed doses yet Disp. Time Eilene Ghazi Time) Disposition Final User 04/30/2021 11:53:14 AM Attempt made - message left Emch, RN, Ronalee Belts 04/30/2021 12:40:17 PM Pharmacy Call Emch, RN, Ronalee Belts Reason: OLMESARTAN/ MEDOXOMIL '20MG'$  verbal to patient (2) po Q daily Rx written as (1) Qdaily rx# [id number] [facility] [phone] Disp 3 only as loaner dose she has 1 left this amount will get her covered sunday and monday morning 04/30/2021 12:40:37 PM Clinical Call Yes Emch, RN, Ronalee Belts PLEASE NOTE: All timestamps contained within this report are represented as [place]  Standard Time. CONFIDENTIALTY NOTICE: This fax transmission is intended only for the addressee. It contains information that is legally privileged, confidential or otherwise protected from use or disclosure. If you are not the intended recipient, you are strictly prohibited from reviewing, disclosing, copying using or disseminating any of this information or taking any action in reliance on or regarding this information. If you have received this fax in error, please notify us immediately by telephone so that we can arrange for its return to [place]. Phone: [phone], Toll-Free: [phone], Fax: [phone] Page: 2 of 2 Call Id: [id number] Comments User: Celene Kras Date/Time Eilene Ghazi Time): 04/30/2021 12:29:39 PM Thank you Ronalee Belts! User: Doretha Sou, RN Date/Time Eilene Ghazi Time): 04/30/2021 12:41:23 PM Nurse may call in refills on maintenance medications: o Volume sufficient until office opens o Refills only called into pharmacy where originally filled,

## 2021-05-02 NOTE — Telephone Encounter (Signed)
Also see pt message. Pt seen 03/03/21. Sending note to Gentry Fitz NP and [facility] CMA.

## 2021-05-02 NOTE — Telephone Encounter (Signed)
Please read carefully:  Please call patient and verify that she is taking two of the olmesartan 20 mg tablets for blood pressure (40 mg total). If she is taking two of the olmesartan 20 mg (should be doing so) then tell her that I'm sending in olmesartan 40 mg and to only take ONE tablet once daily.   Okay to send in pended refill.  Also clarify to make sure she's taking carvedilol 3.125 mg BID and NOT taking potassium chloride.   I would like to see her for BP follow up within the next month. Please schedule.

## 2021-05-02 NOTE — Telephone Encounter (Signed)
Received another message today from patient stating that meds are not helping with blood pressure or swelling. Returned message to patient asking to call for appointment.

## 2021-05-02 NOTE — Telephone Encounter (Signed)
Called patient she is currently taking 2 tab of the olmesartan daily. Made aware and had repeat back to me that she is only to take one due to change in refill being sent in for 40 mg. She states she is taking the Carvedilol 3.125 bid. She is not taking the potassium chloride. I have made follow up for 8/25 at 3:00. She will call if any changes. Have called in refill as pended.

## 2021-05-13 ENCOUNTER — Other Ambulatory Visit: Payer: Self-pay | Admitting: Primary Care

## 2021-05-13 DIAGNOSIS — G8929 Other chronic pain: Secondary | ICD-10-CM

## 2021-05-19 ENCOUNTER — Ambulatory Visit: Payer: BC Managed Care – PPO | Admitting: Primary Care

## 2021-05-19 ENCOUNTER — Other Ambulatory Visit: Payer: Self-pay

## 2021-05-19 ENCOUNTER — Encounter: Payer: Self-pay | Admitting: Primary Care

## 2021-05-19 VITALS — BP 168/94 | HR 83 | Temp 97.8°F | Ht 64.5 in | Wt 280.0 lb

## 2021-05-19 DIAGNOSIS — I10 Essential (primary) hypertension: Secondary | ICD-10-CM | POA: Diagnosis not present

## 2021-05-19 MED ORDER — HYDROCHLOROTHIAZIDE 12.5 MG PO TABS: 12.5000 mg | ORAL_TABLET | Freq: Every day | ORAL | 0 refills | Status: DC

## 2021-05-19 NOTE — Assessment & Plan Note (Signed)
Uncontrolled and actually much higher than last visit.  She did very well on olmesartan 20 mg and amlodipine 10 mg but she could not tolerate side effects from amlodipine. HCTZ 25 mg caused hypokalemia. Carvedilol appears to be having no effect, at least at this dose.  Stop carvedilol. Start HCTZ 12.5 mg daily. Continue olmesartan 40 mg.   We will see her back in 2-3 weeks for BP check and BMP.

## 2021-05-19 NOTE — Progress Notes (Signed)
Subjective:    Patient ID: Rachel Swanson, female    DOB: [birth year], 45 y.o.   MRN: [id number]  HPI  Rachel Swanson is a very pleasant 45 y.o. female with a history of hypertension, GERD, obesity, prediabetes who presents today for follow up of hypertension.  Currently managed on olmesartan 40 mg and carvedilol 3.125 mg BID. She was last evaluated in the office in June 2022 for follow up of uncontrolled hypertension.   Her blood pressure responded well to amlodipine but could not tolerate given side effects of pedal edema, HCTZ was contributing to hypokalemia and she no longer wanted to take her oral potassium so this was discontinued. She experienced a cough with ACE-I treatment. She was eventually placed on olmesartan (titrated up to 40 mg) and carvedilol 3.125 mg BID for BP. She is here today for follow up.  Since her last visit she is compliant to olmesartan 40 mg and carvedilol 3.125 mg BID. She is checking her BP at home and is getting readings in the [id number] systolic.   She has been taking Aleve twice weekly for left lower extremity pain. Pain has resolved now. She denies dizziness, headaches, blurred vision.   She admits to gaining weight, has recently begun exercising and is working on her diet. She is hoping to move forward with bariatric surgery soon.   BP Readings from Last 3 Encounters:  05/19/21 (!) 178/94  03/03/21 126/72  02/17/21 136/90   Wt Readings from Last 3 Encounters:  05/19/21 280 lb (127 kg)  03/03/21 266 lb (120.7 kg)  02/17/21 268 lb 8 oz (121.8 kg)      Review of Systems  Eyes:  Negative for visual disturbance.  Respiratory:  Negative for shortness of breath.   Cardiovascular:  Negative for chest pain.  Neurological:  Negative for dizziness and headaches.        Past Medical History:  Diagnosis Date   GERD (gastroesophageal reflux disease)    Hypertension    Prediabetes    Seasonal allergies     Social History    Socioeconomic History   Marital status: Single    Spouse name: Not on file   Number of children: 0   Years of education: Not on file   Highest education level: Not on file  Occupational History   Occupation: Teacher  Tobacco Use   Smoking status: Never   Smokeless tobacco: Never  Vaping Use   Vaping Use: Never used  Substance and Sexual Activity   Alcohol use: Yes    Comment: 1-2 per month   Drug use: No   Sexual activity: Yes    Partners: Male    Birth control/protection: Condom  Other Topics Concern   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    Social Determinants of Health   Financial Resource Strain: Not on file  Food Insecurity: Not on file  Transportation Needs: Not on file  Physical Activity: Not on file  Stress: Not on file  Social Connections: Not on file  Intimate Partner Violence: Not on file    Past Surgical History:  Procedure Laterality Date   BUNIONECTOMY Right    WISDOM TOOTH EXTRACTION      Family History  Problem Relation Age of Onset   Heart disease Mother    Hypertension Mother    Hyperlipidemia Father    Heart attack Father    Diabetes Sister  Breast cancer Paternal Aunt    Breast cancer Cousin        paternal   Colon cancer Neg Hx    Esophageal cancer Neg Hx    Rectal cancer Neg Hx    Stomach cancer Neg Hx     Allergies  Allergen Reactions   Ace Inhibitors Cough    Current Outpatient Medications on File Prior to Visit  Medication Sig Dispense Refill   albuterol (VENTOLIN HFA) 108 (90 Base) MCG/ACT inhaler Inhale 2 puffs into the lungs every 6 (six) hours as needed for wheezing or shortness of breath. 8 g 0   diclofenac Sodium (VOLTAREN) 1 % GEL Apply 2 g topically 3 (three) times daily as needed (pain). 100 g 5   fluticasone (FLONASE) 50 MCG/ACT nasal spray Place 1 spray into both nostrils 2 (two) times daily. For allergies. 48 mL 3    levocetirizine (XYZAL) 5 MG tablet TAKE 1 TABLET (5 MG TOTAL) BY MOUTH EVERY EVENING. FOR COUGH/ALLERGIES 90 tablet 2   montelukast (SINGULAIR) 10 MG tablet TAKE 1 TABLET BY MOUTH EVERYDAY AT BEDTIME 90 tablet 1   olmesartan (BENICAR) 40 MG tablet Take 1 tablet (40 mg total) by mouth daily. For blood pressure. 90 tablet 0   omeprazole (PRILOSEC) 20 MG capsule Take 1 capsule (20 mg total) by mouth daily. For heartburn. 90 capsule 3   No current facility-administered medications on file prior to visit.    BP (!) 178/94   Pulse 83   Temp 97.8 F (36.6 C) (Temporal)   Ht 5' 4.5" (1.638 m)   Wt 280 lb (127 kg)   SpO2 98%   BMI 47.32 kg/m  Objective:   Physical Exam Cardiovascular:     Rate and Rhythm: Normal rate and regular rhythm.  Pulmonary:     Effort: Pulmonary effort is normal.     Breath sounds: Normal breath sounds.  Musculoskeletal:     Cervical back: Neck supple.  Skin:    General: Skin is warm and dry.          Assessment & Plan:      This visit occurred during the SARS-CoV-2 public health emergency.  Safety protocols were in place, including screening questions prior to the visit, additional usage of staff PPE, and extensive cleaning of exam room while observing appropriate contact time as indicated for disinfecting solutions.

## 2021-05-19 NOTE — Patient Instructions (Signed)
Stop carvedilol for blood pressure.  Start hydrochlorothiazide 12.5 mg once daily for blood pressure.  Continue olmesartan 40 mg daily for blood pressure.  Please schedule a follow up visit to meet back with me in 2-3 weeks for blood pressure check.   It was a pleasure to see you today!

## 2021-05-21 ENCOUNTER — Other Ambulatory Visit: Payer: Self-pay | Admitting: Primary Care

## 2021-05-21 DIAGNOSIS — J3089 Other allergic rhinitis: Secondary | ICD-10-CM

## 2021-06-02 ENCOUNTER — Ambulatory Visit: Payer: BC Managed Care – PPO | Admitting: Primary Care

## 2021-06-02 ENCOUNTER — Other Ambulatory Visit: Payer: Self-pay | Admitting: Primary Care

## 2021-06-02 ENCOUNTER — Other Ambulatory Visit: Payer: Self-pay

## 2021-06-02 ENCOUNTER — Encounter: Payer: Self-pay | Admitting: Primary Care

## 2021-06-02 VITALS — BP 120/74 | HR 76 | Temp 96.0°F | Ht 64.5 in | Wt 274.0 lb

## 2021-06-02 DIAGNOSIS — M25562 Pain in left knee: Secondary | ICD-10-CM | POA: Diagnosis not present

## 2021-06-02 DIAGNOSIS — Z23 Encounter for immunization: Secondary | ICD-10-CM

## 2021-06-02 DIAGNOSIS — I10 Essential (primary) hypertension: Secondary | ICD-10-CM

## 2021-06-02 DIAGNOSIS — M25561 Pain in right knee: Secondary | ICD-10-CM

## 2021-06-02 DIAGNOSIS — G8929 Other chronic pain: Secondary | ICD-10-CM | POA: Diagnosis not present

## 2021-06-02 LAB — BASIC METABOLIC PANEL
BUN: 15 mg/dL (ref 6–23)
CO2: 30 mEq/L (ref 19–32)
Calcium: 9.4 mg/dL (ref 8.4–10.5)
Chloride: 101 mEq/L (ref 96–112)
Creatinine, Ser: 0.93 mg/dL (ref 0.40–1.20)
GFR: 73.12 mL/min (ref >60.00)
Glucose, Bld: 96 mg/dL (ref 70–99)
Potassium: 3.7 mEq/L (ref 3.5–5.1)
Sodium: 137 mEq/L (ref 135–145)

## 2021-06-02 MED ORDER — DICLOFENAC SODIUM 1 % EX GEL: 2.0000 g | Freq: Three times a day (TID) | CUTANEOUS | 2 refills | Status: AC | PRN

## 2021-06-02 MED ORDER — HYDROCHLOROTHIAZIDE 12.5 MG PO TABS: 12.5000 mg | ORAL_TABLET | Freq: Every day | ORAL | 3 refills | Status: DC

## 2021-06-02 NOTE — Progress Notes (Signed)
Subjective:    Patient ID: Rachel Swanson, female    DOB: 04/10/1976, 45 y.o.   MRN: [id number]  HPI  Rachel Swanson is a very pleasant 45 y.o. female with a history of hypertension, morbid obesity, prediabetes who presents today for follow up of hypertension.   She is also wanting a refill of her diclofenac gel.   She was last evaluated on 05/19/21 for follow up of uncontrolled hypertension. During this visit BP was above goal despite olmesartan 40 mg and carvedilol 6.25 mg BID, so HCTZ 12.5 mg was added and olmesartan 40 mg was continued.   Since her last visit she is compliant to her HCTZ 12.5 mg and olmesartan 40 mg. She is tracking BP at home which is running 120's/80's-90's. She denies   She is also in process for gastric bypass surgery, following with [facility].   BP Readings from Last 3 Encounters:  06/02/21 120/74  05/19/21 (!) 168/94  03/03/21 126/72        Review of Systems  Constitutional:  Negative for fatigue.  Eyes:  Negative for visual disturbance.  Respiratory:  Negative for shortness of breath.   Cardiovascular:  Negative for chest pain.  Neurological:  Negative for dizziness and headaches.        Past Medical History:  Diagnosis Date   GERD (gastroesophageal reflux disease)    Hypertension    Prediabetes    Seasonal allergies     Social History   Socioeconomic History   Marital status: Single    Spouse name: Not on file   Number of children: 0   Years of education: Not on file   Highest education level: Not on file  Occupational History   Occupation: Teacher  Tobacco Use   Smoking status: Never   Smokeless tobacco: Never  Vaping Use   Vaping Use: Never used  Substance and Sexual Activity   Alcohol use: Yes    Comment: 1-2 per month   Drug use: No   Sexual activity: Yes    Partners: Male    Birth control/protection: Condom  Other Topics Concern   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    Enjoys exercising, visiting with her niece.    Social Determinants of Health   Financial Resource Strain: Not on file  Food Insecurity: Not on file  Transportation Needs: Not on file  Physical Activity: Not on file  Stress: Not on file  Social Connections: Not on file  Intimate Partner Violence: Not on file    Past Surgical History:  Procedure Laterality Date   BUNIONECTOMY Right    WISDOM TOOTH EXTRACTION      Family History  Problem Relation Age of Onset   Heart disease Mother    Hypertension Mother    Hyperlipidemia Father    Heart attack Father    Diabetes Sister    Breast cancer Paternal Aunt    Breast cancer Cousin        paternal   Colon cancer Neg Hx    Esophageal cancer Neg Hx    Rectal cancer Neg Hx    Stomach cancer Neg Hx     Allergies  Allergen Reactions   Ace Inhibitors Cough    Current Outpatient Medications on File Prior to Visit  Medication Sig Dispense Refill   albuterol (VENTOLIN HFA) 108 (90 Base) MCG/ACT inhaler Inhale 2 puffs into the lungs every 6 (six) hours as needed for wheezing or  shortness of breath. 8 g 0   diclofenac Sodium (VOLTAREN) 1 % GEL Apply 2 g topically 3 (three) times daily as needed (pain). 100 g 5   fluticasone (FLONASE) 50 MCG/ACT nasal spray Place 1 spray into both nostrils 2 (two) times daily. For allergies. 48 mL 3   hydrochlorothiazide (HYDRODIURIL) 12.5 MG tablet Take 1 tablet (12.5 mg total) by mouth daily. For blood pressure. 30 tablet 0   levocetirizine (XYZAL) 5 MG tablet TAKE 1 TABLET (5 MG TOTAL) BY MOUTH EVERY EVENING. FOR COUGH/ALLERGIES 90 tablet 2   montelukast (SINGULAIR) 10 MG tablet Take 1 tablet (10 mg total) by mouth at bedtime. For allergies. 90 tablet 1   olmesartan (BENICAR) 40 MG tablet Take 1 tablet (40 mg total) by mouth daily. For blood pressure. 90 tablet 0   omeprazole (PRILOSEC) 20 MG capsule Take 1 capsule (20 mg total) by mouth daily. For heartburn. 90 capsule 3   No current  facility-administered medications on file prior to visit.    BP 120/74   Pulse 76   Temp (!) 96 F (35.6 C) (Temporal)   Ht 5' 4.5" (1.638 m)   Wt 274 lb (124.3 kg)   SpO2 98%   BMI 46.31 kg/m  Objective:   Physical Exam Cardiovascular:     Rate and Rhythm: Normal rate and regular rhythm.  Pulmonary:     Effort: Pulmonary effort is normal.     Breath sounds: Normal breath sounds.  Musculoskeletal:     Cervical back: Neck supple.  Skin:    General: Skin is warm and dry.  Psychiatric:        Mood and Affect: Mood normal.          Assessment & Plan:      This visit occurred during the SARS-CoV-2 public health emergency.  Safety protocols were in place, including screening questions prior to the visit, additional usage of staff PPE, and extensive cleaning of exam room while observing appropriate contact time as indicated for disinfecting solutions.

## 2021-06-02 NOTE — Assessment & Plan Note (Signed)
Well controlled in the office today on HCTZ 12.5 mg and olmesartan 40 mg.   Repeat BMP pending.  Continue HCTZ 12.5 mg and olmesartan 40 mg daily.

## 2021-06-02 NOTE — Patient Instructions (Addendum)
Continue hydrochlorothiazide 12.5 mg and olmesartan 40 mg daily for blood pressure.  Stop by the lab prior to leaving today. I will notify you of your results once received.   It was a pleasure to see you today!         Influenza (Flu) Vaccine (Inactivated or Recombinant): What You Need to Know 1. Why get vaccinated? Influenza vaccine can prevent influenza (flu). Flu is a contagious disease that spreads around the [place] every year, usually between October and May. Anyone can get the flu, but it is more dangerous for some people. Infants and young children, people 37 years and older, pregnant people, and people with certain health conditions or a weakened immune system are at greatest risk of flu complications. Pneumonia, bronchitis, sinus infections, and ear infections are examples of flu-related complications. If you have a medical condition, such as heart disease, cancer, or diabetes, flu can make it worse. Flu can cause fever and chills, sore throat, muscle aches, fatigue, cough, headache, and runny or stuffy nose. Some people may have vomiting and diarrhea, though this is more common in children than adults. In an average year, thousands of people in the [place] die from flu, and many more are hospitalized. Flu vaccine prevents millions of illnesses and flu-related visits to the doctor each year. 2. Influenza vaccines CDC recommends everyone 6 months and older get vaccinated every flu season. Children 6 months through 71 years of age may need 2 doses during a single flu season. Everyone else needs only 1 dose each flu season. It takes about 2 weeks for protection to develop after vaccination. There are many flu viruses, and they are always changing. Each year a new flu vaccine is made to protect against the influenza viruses believed to be likely to cause disease in the upcoming flu season. Even when the vaccine doesn't exactly match these viruses, it may still provide some  protection. Influenza vaccine does not cause flu. Influenza vaccine may be given at the same time as other vaccines. 3. Talk with your health care provider Tell your vaccination provider if the person getting the vaccine: Has had an allergic reaction after a previous dose of influenza vaccine, or has any severe, life-threatening allergies Has ever had Guillain-Barr Syndrome (also called "GBS") In some cases, your health care provider may decide to postpone influenza vaccination until a future visit. Influenza vaccine can be administered at any time during pregnancy. People who are or will be pregnant during influenza season should receive inactivated influenza vaccine. People with minor illnesses, such as a cold, may be vaccinated. People who are moderately or severely ill should usually wait until they recover before getting influenza vaccine. Your health care provider can give you more information. 4. Risks of a vaccine reaction Soreness, redness, and swelling where the shot is given, fever, muscle aches, and headache can happen after influenza vaccination. There may be a very small increased risk of Guillain-Barr Syndrome (GBS) after inactivated influenza vaccine (the flu shot). Young children who get the flu shot along with pneumococcal vaccine (PCV13) and/or DTaP vaccine at the same time might be slightly more likely to have a seizure caused by fever. Tell your health care provider if a child who is getting flu vaccine has ever had a seizure. People sometimes faint after medical procedures, including vaccination. Tell your provider if you feel dizzy or have vision changes or ringing in the ears. As with any medicine, there is a very remote chance of a vaccine  causing a severe allergic reaction, other serious injury, or death. 5. What if there is a serious problem? An allergic reaction could occur after the vaccinated person leaves the clinic. If you see signs of a severe allergic reaction  (hives, swelling of the face and throat, difficulty breathing, a fast heartbeat, dizziness, or weakness), call 9-1-1 and get the person to the nearest hospital. For other signs that concern you, call your health care provider. Adverse reactions should be reported to the Vaccine Adverse Event Reporting System (VAERS). Your health care provider will usually file this report, or you can do it yourself. Visit the VAERS website at www.vaers.SamedayNews.es or call (657)183-9917. VAERS is only for reporting reactions, and VAERS staff members do not give medical advice. 6. The National Vaccine Injury Compensation Program The [employer] Vaccine Injury Compensation Program (VICP) is a federal program that was created to compensate people who may have been injured by certain vaccines. Claims regarding alleged injury or death due to vaccination have a time limit for filing, which may be as short as two years. Visit the VICP website at GoldCloset.com.ee or call (813)144-6054 to learn about the program and about filing a claim. 7. How can I learn more? Ask your health care provider. Call your local or state health department. Visit the website of the Food and Drug Administration (FDA) for vaccine package inserts and additional information at TraderRating.uy. Contact the Centers for Disease Control and Prevention (CDC): Call 873-304-8503 (1-800-CDC-INFO) or Visit CDC's website at https://gibson.com/. Vaccine Information Statement Inactivated Influenza Vaccine (04/30/2020) This information is not intended to replace advice given to you by your health care provider. Make sure you discuss any questions you have with your health care provider. Document Revised: 06/17/2020 Document Reviewed: 06/17/2020 Elsevier Patient Education  2022 [employer].

## 2021-06-06 ENCOUNTER — Other Ambulatory Visit (HOSPITAL_COMMUNITY): Payer: Self-pay | Admitting: General Surgery

## 2021-06-06 ENCOUNTER — Other Ambulatory Visit: Payer: Self-pay | Admitting: General Surgery

## 2021-06-06 DIAGNOSIS — K219 Gastro-esophageal reflux disease without esophagitis: Secondary | ICD-10-CM

## 2021-06-10 ENCOUNTER — Ambulatory Visit
Admission: RE | Admit: 2021-06-10 | Discharge: 2021-06-10 | Disposition: A | Discharge status: Home / Self Care | Payer: BC Managed Care – PPO | Source: Ambulatory Visit | Attending: Primary Care | Admitting: Primary Care

## 2021-06-10 ENCOUNTER — Other Ambulatory Visit: Payer: Self-pay

## 2021-06-10 DIAGNOSIS — Z1231 Encounter for screening mammogram for malignant neoplasm of breast: Secondary | ICD-10-CM

## 2021-06-20 ENCOUNTER — Inpatient Hospital Stay (HOSPITAL_COMMUNITY): Admission: RE | Admit: 2021-06-20 | Payer: BC Managed Care – PPO | Source: Ambulatory Visit

## 2021-06-20 ENCOUNTER — Encounter (HOSPITAL_COMMUNITY): Payer: Self-pay

## 2021-06-23 ENCOUNTER — Encounter: Payer: Self-pay | Admitting: Obstetrics & Gynecology

## 2021-06-23 ENCOUNTER — Other Ambulatory Visit (HOSPITAL_COMMUNITY)
Admission: RE | Admit: 2021-06-23 | Discharge: 2021-06-23 | Disposition: A | Discharge status: Home / Self Care | Payer: BC Managed Care – PPO | Source: Ambulatory Visit | Attending: Obstetrics & Gynecology | Admitting: Obstetrics & Gynecology

## 2021-06-23 ENCOUNTER — Ambulatory Visit (INDEPENDENT_AMBULATORY_CARE_PROVIDER_SITE_OTHER): Payer: BC Managed Care – PPO | Admitting: Obstetrics & Gynecology

## 2021-06-23 ENCOUNTER — Other Ambulatory Visit: Payer: Self-pay

## 2021-06-23 VITALS — BP 138/85 | HR 71 | Ht 65.5 in | Wt 275.0 lb

## 2021-06-23 DIAGNOSIS — R87810 Cervical high risk human papillomavirus (HPV) DNA test positive: Secondary | ICD-10-CM | POA: Insufficient documentation

## 2021-06-23 DIAGNOSIS — Z01419 Encounter for gynecological examination (general) (routine) without abnormal findings: Secondary | ICD-10-CM | POA: Insufficient documentation

## 2021-06-23 DIAGNOSIS — R87610 Atypical squamous cells of undetermined significance on cytologic smear of cervix (ASC-US): Secondary | ICD-10-CM | POA: Insufficient documentation

## 2021-06-23 NOTE — Patient Instructions (Signed)
Preventive Care 52-47 Years Old, Female Preventive care refers to lifestyle choices and visits with your health care provider that can promote health and wellness. This includes: A yearly physical exam. This is also called an annual wellness visit. Regular dental and eye exams. Immunizations. Screening for certain conditions. Healthy lifestyle choices, such as: Eating a healthy diet. Getting regular exercise. Not using drugs or products that contain nicotine and tobacco. Limiting alcohol use. What can I expect for my preventive care visit? Physical exam Your health care provider will check your: Height and weight. These may be used to calculate your BMI (body mass index). BMI is a measurement that tells if you are at a healthy weight. Heart rate and blood pressure. Body temperature. Skin for abnormal spots. Counseling Your health care provider may ask you questions about your: Past medical problems. Family's medical history. Alcohol, tobacco, and drug use. Emotional well-being. Home life and relationship well-being. Sexual activity. Diet, exercise, and sleep habits. Work and work Statistician. Access to firearms. Method of birth control. Menstrual cycle. Pregnancy history. What immunizations do I need? Vaccines are usually given at various ages, according to a schedule. Your health care provider will recommend vaccines for you based on your age, medical history, and lifestyle or other factors, such as travel or where you work. What tests do I need? Blood tests Lipid and cholesterol levels. These may be checked every 5 years, or more often if you are over 60 years old. Hepatitis C test. Hepatitis B test. Screening Lung cancer screening. You may have this screening every year starting at age 48 if you have a 30-pack-year history of smoking and currently smoke or have quit within the past 15 years. Colorectal cancer screening. All adults should have this screening starting at  age 30 and continuing until age 63. Your health care provider may recommend screening at age 63 if you are at increased risk. You will have tests every 1-10 years, depending on your results and the type of screening test. Diabetes screening. This is done by checking your blood sugar (glucose) after you have not eaten for a while (fasting). You may have this done every 1-3 years. Mammogram. This may be done every 1-2 years. Talk with your health care provider about when you should start having regular mammograms. This may depend on whether you have a family history of breast cancer. BRCA-related cancer screening. This may be done if you have a family history of breast, ovarian, tubal, or peritoneal cancers. Pelvic exam and Pap test. This may be done every 3 years starting at age 99. Starting at age 26, this may be done every 5 years if you have a Pap test in combination with an HPV test. Other tests STD (sexually transmitted disease) testing, if you are at risk. Bone density scan. This is done to screen for osteoporosis. You may have this scan if you are at high risk for osteoporosis. Talk with your health care provider about your test results, treatment options, and if necessary, the need for more tests. Follow these instructions at home: Eating and drinking  Eat a diet that includes fresh fruits and vegetables, whole grains, lean protein, and low-fat dairy products. Take vitamin and mineral supplements as recommended by your health care provider. Do not drink alcohol if: Your health care provider tells you not to drink. You are pregnant, may be pregnant, or are planning to become pregnant. If you drink alcohol: Limit how much you have to 0-1 drink a day. Be  aware of how much alcohol is in your drink. In the [place] one drink equals one 12 oz bottle of beer (355 mL), one 5 oz glass of wine (148 mL), or one 1 oz glass of hard liquor (44 mL). Lifestyle Take daily care of your teeth and  gums. Brush your teeth every morning and night with fluoride toothpaste. Floss one time each day. Stay active. Exercise for at least 30 minutes 5 or more days each week. Do not use any products that contain nicotine or tobacco, such as cigarettes, e-cigarettes, and chewing tobacco. If you need help quitting, ask your health care provider. Do not use drugs. If you are sexually active, practice safe sex. Use a condom or other form of protection to prevent STIs (sexually transmitted infections). If you do not wish to become pregnant, use a form of birth control. If you plan to become pregnant, see your health care provider for a prepregnancy visit. If told by your health care provider, take low-dose aspirin daily starting at age 76. Find healthy ways to cope with stress, such as: Meditation, yoga, or listening to music. Journaling. Talking to a trusted person. Spending time with friends and family. Safety Always wear your seat belt while driving or riding in a vehicle. Do not drive: If you have been drinking alcohol. Do not ride with someone who has been drinking. When you are tired or distracted. While texting. Wear a helmet and other protective equipment during sports activities. If you have firearms in your house, make sure you follow all gun safety procedures. What's next? Visit your health care provider once a year for an annual wellness visit. Ask your health care provider how often you should have your eyes and teeth checked. Stay up to date on all vaccines. This information is not intended to replace advice given to you by your health care provider. Make sure you discuss any questions you have with your health care provider. Document Revised: 11/19/2020 Document Reviewed: 05/23/2018 Elsevier Patient Education  2022 [employer].

## 2021-06-23 NOTE — Progress Notes (Signed)
GYNECOLOGY ANNUAL PREVENTATIVE CARE ENCOUNTER NOTE  History:     Rachel Swanson is a 45 y.o. G28P0010 female here for a routine annual gynecologic exam.  Current complaints: none.   Denies abnormal vaginal bleeding, discharge, pelvic pain, problems with intercourse or other gynecologic concerns.    Gynecologic History Patient's last menstrual period was 06/20/2021 (approximate). Contraception: abstinence Last Pap: 03/15/2020. Result was ASCUS with negative HPV Last Mammogram: 06/10/2021.  Result was normal Last Colonoscopy: 2021.  Result was normal   Obstetric History OB History  Gravida Para Term Preterm AB Living  1       1    SAB IAB Ectopic Multiple Live Births  1            # Outcome Date GA Lbr Len/2nd Weight Sex Delivery Anes PTL Lv  1 SAB             Past Medical History:  Diagnosis Date   GERD (gastroesophageal reflux disease)    Hypertension    Prediabetes    Seasonal allergies     Past Surgical History:  Procedure Laterality Date   BUNIONECTOMY Right    WISDOM TOOTH EXTRACTION      Current Outpatient Medications on File Prior to Visit  Medication Sig Dispense Refill   albuterol (VENTOLIN HFA) 108 (90 Base) MCG/ACT inhaler Inhale 2 puffs into the lungs every 6 (six) hours as needed for wheezing or shortness of breath. 8 g 0   diclofenac Sodium (VOLTAREN) 1 % GEL Apply 2 g topically 3 (three) times daily as needed (pain). 100 g 2   fluticasone (FLONASE) 50 MCG/ACT nasal spray Place 1 spray into both nostrils 2 (two) times daily. For allergies. 48 mL 3   hydrochlorothiazide (HYDRODIURIL) 12.5 MG tablet Take 1 tablet (12.5 mg total) by mouth daily. For blood pressure. 90 tablet 3   levocetirizine (XYZAL) 5 MG tablet TAKE 1 TABLET (5 MG TOTAL) BY MOUTH EVERY EVENING. FOR COUGH/ALLERGIES 90 tablet 2   montelukast (SINGULAIR) 10 MG tablet Take 1 tablet (10 mg total) by mouth at bedtime. For allergies. 90 tablet 1   olmesartan (BENICAR) 40 MG tablet Take 1  tablet (40 mg total) by mouth daily. For blood pressure. 90 tablet 0   omeprazole (PRILOSEC) 20 MG capsule Take 1 capsule (20 mg total) by mouth daily. For heartburn. 90 capsule 3   No current facility-administered medications on file prior to visit.    Allergies  Allergen Reactions   Ace Inhibitors Cough    Social History:  reports that she has never smoked. She has never used smokeless tobacco. She reports current alcohol use. She reports that she does not use drugs.  Family History  Problem Relation Age of Onset   Heart disease Mother    Hypertension Mother    Hyperlipidemia Father    Heart attack Father    Diabetes Sister    Breast cancer Paternal Aunt    Breast cancer Cousin        paternal   Colon cancer Neg Hx    Esophageal cancer Neg Hx    Rectal cancer Neg Hx    Stomach cancer Neg Hx     The following portions of the patient's history were reviewed and updated as appropriate: allergies, current medications, past family history, past medical history, past social history, past surgical history and problem list.  Review of Systems Pertinent items noted in HPI and remainder of comprehensive ROS otherwise negative.  Physical Exam:  BP  138/85   Pulse 71   Ht 5' 5.5" (1.664 m)   Wt 275 lb (124.7 kg)   LMP 06/20/2021 (Approximate)   BMI 45.07 kg/m  CONSTITUTIONAL: Well-developed, well-nourished female in no acute distress.  HENT:  Normocephalic, atraumatic, External right and left ear normal.  EYES: Conjunctivae and EOM are normal. Pupils are equal, round, and reactive to light. No scleral icterus.  NECK: Normal range of motion, supple, no masses.  Normal thyroid.  SKIN: Skin is warm and dry. No rash noted. Not diaphoretic. No erythema. No pallor. MUSCULOSKELETAL: Normal range of motion. No tenderness.  No cyanosis, clubbing, or edema.   NEUROLOGIC: Alert and oriented to person, place, and time. Normal reflexes, muscle tone coordination. No cranial nerve deficit  noted. PSYCHIATRIC: Normal mood and affect. Normal behavior. Normal judgment and thought content. CARDIOVASCULAR: Normal heart rate noted, regular rhythm RESPIRATORY: Clear to auscultation bilaterally. Effort and breath sounds normal, no problems with respiration noted. BREASTS: Symmetric in size. No masses, tenderness, skin changes, nipple drainage, or lymphadenopathy bilaterally.  Performed in the presence of a chaperone. ABDOMEN: Soft, obese, no distention appreciated.  No tenderness, rebound or guarding.  PELVIC: Normal appearing external genitalia and urethral meatus; normal appearing vaginal mucosa and cervix.  Normal appearing discharge.  Pap smear obtained.  Unable to palpate uterus or adnexa fully secondary to habitus.  Performed in the presence of a chaperone.   Assessment and Plan:    1. ASCUS of cervix with negative high risk HPV - Cytology - PAP  2. Well woman exam with routine gynecological exam - Cytology - PAP - RPR+HBsAg+HCVAb+HIV as she desires annual STI check Will follow up results and manage accordingly. Mammogram  and colon cancer screening up to date. Routine preventative health maintenance measures emphasized. Please refer to After Visit Summary for other counseling recommendations.      Verita Schneiders, MD, [place] for [facility], [place]

## 2021-06-24 LAB — RPR+HBSAG+HCVAB+...
HIV Screen 4th Generation wRfx: NONREACTIVE
Hep C Virus Ab: <0.1 s/co ratio (ref 0.0–0.9)
Hepatitis B Surface Ag: NEGATIVE
RPR Ser Ql: NONREACTIVE

## 2021-06-30 ENCOUNTER — Encounter: Payer: Self-pay | Admitting: Obstetrics & Gynecology

## 2021-06-30 LAB — CYTOLOGY - PAP
Chlamydia: NEGATIVE
Comment: NEGATIVE
Comment: NEGATIVE
Comment: NEGATIVE
Comment: NEGATIVE
Comment: NORMAL
Diagnosis: UNDETERMINED — AB
HPV 16: NEGATIVE
HPV 18 / 45: NEGATIVE
High risk HPV: POSITIVE — AB
Neisseria Gonorrhea: NEGATIVE
Trichomonas: NEGATIVE

## 2021-07-04 ENCOUNTER — Telehealth: Payer: Self-pay | Admitting: *Deleted

## 2021-07-04 NOTE — Telephone Encounter (Signed)
-----   Message from Rachel Oman, MD sent at 06/30/2021  2:45 PM EDT ----- Please schedule patient for colposcopy for ASCUS +HPV pap done on 06/23/21. Please call to inform patient of results and need for appointment.

## 2021-07-04 NOTE — Telephone Encounter (Signed)
Pt informed of pap results and the need to schedule a colposcopy.

## 2021-07-07 ENCOUNTER — Encounter: Payer: BC Managed Care – PPO | Attending: General Surgery | Admitting: Skilled Nursing Facility1

## 2021-07-21 ENCOUNTER — Inpatient Hospital Stay (HOSPITAL_COMMUNITY): Admission: RE | Admit: 2021-07-21 | Payer: BC Managed Care – PPO | Source: Ambulatory Visit

## 2021-07-21 ENCOUNTER — Encounter (HOSPITAL_COMMUNITY): Payer: Self-pay

## 2021-07-28 ENCOUNTER — Other Ambulatory Visit: Payer: Self-pay | Admitting: Primary Care

## 2021-07-28 DIAGNOSIS — I10 Essential (primary) hypertension: Secondary | ICD-10-CM

## 2021-08-09 ENCOUNTER — Other Ambulatory Visit: Payer: Self-pay | Admitting: Obstetrics & Gynecology

## 2021-08-09 ENCOUNTER — Ambulatory Visit (INDEPENDENT_AMBULATORY_CARE_PROVIDER_SITE_OTHER): Payer: BC Managed Care – PPO | Admitting: Obstetrics & Gynecology

## 2021-08-09 ENCOUNTER — Encounter: Payer: Self-pay | Admitting: Obstetrics & Gynecology

## 2021-08-09 ENCOUNTER — Other Ambulatory Visit: Payer: Self-pay

## 2021-08-09 VITALS — BP 131/85 | HR 73 | Wt 277.6 lb

## 2021-08-09 DIAGNOSIS — R87810 Cervical high risk human papillomavirus (HPV) DNA test positive: Secondary | ICD-10-CM

## 2021-08-09 DIAGNOSIS — Z01812 Encounter for preprocedural laboratory examination: Secondary | ICD-10-CM | POA: Diagnosis not present

## 2021-08-09 DIAGNOSIS — R87610 Atypical squamous cells of undetermined significance on cytologic smear of cervix (ASC-US): Secondary | ICD-10-CM

## 2021-08-09 LAB — POCT URINE PREGNANCY: Preg Test, Ur: NEGATIVE

## 2021-08-09 NOTE — Progress Notes (Signed)
    GYNECOLOGY OFFICE COLPOSCOPY PROCEDURE NOTE  45 y.o. G1P0010 here for colposcopy for ASCUS with POSITIVE high risk HPV pap smear on 06/23/21. Discussed role for HPV in cervical dysplasia, need for surveillance.  Patient gave informed written consent, time out was performed.  Placed in lithotomy position. Cervix viewed with speculum and colposcope after application of acetic acid.   Colposcopy adequate? Yes Acetowhite lesion noted at 2 o'clock; corresponding biopsy obtained.  ECC specimen obtained. All specimens were labeled and sent to pathology.  Patient was given post procedure instructions.  Will follow up pathology and manage accordingly; patient will be contacted with results and recommendations.  Routine preventative health maintenance measures emphasized.    Verita Schneiders, MD, [place] for [facility], [place]

## 2021-08-09 NOTE — Patient Instructions (Signed)
COLPOSCOPY POST-PROCEDURE INSTRUCTIONS  You may take Ibuprofen, Aleve or Tylenol for cramping if needed.  If Monsel's solution was used, you will have a black discharge.  Light bleeding is normal.  If bleeding is heavier than your period, please call.  Put nothing in your vagina until the bleeding or discharge stops (usually 2 or3 days).  We will call you within one week with biopsy results  

## 2021-08-12 ENCOUNTER — Encounter: Payer: Self-pay | Admitting: Obstetrics & Gynecology

## 2021-08-12 DIAGNOSIS — N870 Mild cervical dysplasia: Secondary | ICD-10-CM | POA: Insufficient documentation

## 2021-08-15 ENCOUNTER — Telehealth: Payer: Self-pay

## 2021-08-15 NOTE — Telephone Encounter (Signed)
TC to pt to make aware of recent Colpo results pt not ava LVM for pt to return call to the office.

## 2021-09-21 ENCOUNTER — Other Ambulatory Visit: Payer: Self-pay

## 2021-09-21 ENCOUNTER — Encounter: Payer: BC Managed Care – PPO | Attending: General Surgery | Admitting: Skilled Nursing Facility1

## 2021-09-21 ENCOUNTER — Encounter: Payer: Self-pay | Admitting: Skilled Nursing Facility1

## 2021-09-21 DIAGNOSIS — Z6841 Body Mass Index (BMI) 40.0 and over, adult: Secondary | ICD-10-CM | POA: Diagnosis present

## 2021-09-21 NOTE — Progress Notes (Signed)
Nutrition Assessment for Bariatric Surgery Medical Nutrition Therapy Appt Start Time: 9:14    End Time: 10:15  Patient was seen on 09/21/2021 for Pre-Operative Nutrition Assessment. Letter of approval faxed to [facility] bariatric surgery program coordinator on 09/21/221.   Referral stated Supervised Weight Loss (SWL) visits needed: 0  Pt completed visits.   Pt has cleared nutrition requirements.   Planned surgery: RYGB Pt expectation of surgery:  Pt expectation of dietitian: none stated     NUTRITION ASSESSMENT   Anthropometrics  Start weight at [facility]: 279.8 lbs (date: 09/21/2021)  Height: 64.5 in BMI: 47.29 kg/m2     Clinical  Medical hx: GERD, Hypertension, prediabetes Medications: Benicar, hydrodiuril, voltaren, Singulair, inhaler, Flonase, xyzal, Prilosec  Labs: see list Notable signs/symptoms: reflux  Any previous deficiencies? Yes, vitamin D  Micronutrient Nutrition Focused Physical Exam: Hair: No issues observed Eyes: No issues observed Mouth: No issues observed Neck: No issues observed Nails: No issues observed Skin: No issues observed  Lifestyle & Dietary Hx  Pt states she does not have a weight loss number in mind and just wants to be healthy. Pt states she is tired of the roller coaster of gaining and losing weight. Pt states the prilosec helped more when she was consistent with taking it daily. Pt states she chose the RYGB to help eliminate the reflux. Pt states she is taking magnesium for muscle relaxation. Pt states she has a Building services engineer and a stationary bike at home. Pt states she struggled with constipation in the past but consuming vegetables have really helped her.    24-Hr Dietary Recall First Meal: tumeric ginger drink or takeout or fruit or egg Snack: nuts Second Meal: 10:30 school lunch  Snack: fruit or crackers Third Meal: takeout or chicken or [place] + vegetables + rice Snack: goldfish or chocolate  Beverages: water or  iced tea or lemonade or crystal light    Estimated Energy Needs Calories: 1600   NUTRITION DIAGNOSIS  Overweight/obesity ([place]-3.3) related to past poor dietary habits and physical inactivity as evidenced by patient w/ planned RYGB surgery following dietary guidelines for continued weight loss.    NUTRITION INTERVENTION  Nutrition counseling (C-1) and education (E-2) to facilitate bariatric surgery goals.  Educated pt on micronutrient deficiencies post surgery and strategies to mitigate that risk   Pre-Op Goals Reviewed with the Patient Track food and beverage intake (pen and paper, MyFitness Pal, Baritastic app, etc.) Make healthy food choices while monitoring portion sizes Consume 3 meals per day or try to eat every 3-5 hours Avoid concentrated sugars and fried foods Keep sugar & fat in the single digits per serving on food labels Practice CHEWING your food (aim for applesauce consistency) Practice not drinking 15 minutes before, during, and 30 minutes after each meal and snack Avoid all carbonated beverages (ex: soda, sparkling beverages)  Limit caffeinated beverages (ex: coffee, tea, energy drinks) Avoid all sugar-sweetened beverages (ex: regular soda, sports drinks)  Avoid alcohol  Aim for 64-100 ounces of FLUID daily (with at least half of fluid intake being plain water)  Aim for at least 60-80 grams of PROTEIN daily Look for a liquid protein source that contains ?15 g protein and ?5 g carbohydrate (ex: shakes, drinks, shots) Make a list of non-food related activities Physical activity is an important part of a healthy lifestyle so keep it moving! The goal is to reach 150 minutes of exercise per week, including cardiovascular and weight baring activity.  *Goals that are bolded indicate the  pt would like to start working towards these  Handouts Provided Include  Bariatric Surgery handouts (Nutrition Visits, Pre-Op Goals, Protein Shakes, Vitamins & Minerals)  Learning Style &  Readiness for Change Teaching method utilized: Visual & Auditory  Demonstrated degree of understanding via: Teach Back  Readiness Level: contemplative Barriers to learning/adherence to lifestyle change:    MONITORING & EVALUATION Dietary intake, weekly physical activity, body weight, and pre-op goals reached at next nutrition visit.    Next Steps  Patient is to follow up at [place] for Pre-Op Class >2 weeks before surgery for further nutrition education.  Pt has completed visits. No further supervised visits required/recomended

## 2021-10-20 ENCOUNTER — Other Ambulatory Visit: Payer: Self-pay | Admitting: Primary Care

## 2021-10-20 DIAGNOSIS — J3089 Other allergic rhinitis: Secondary | ICD-10-CM

## 2021-10-21 ENCOUNTER — Other Ambulatory Visit: Payer: Self-pay | Admitting: Primary Care

## 2021-10-21 DIAGNOSIS — J3089 Other allergic rhinitis: Secondary | ICD-10-CM

## 2021-11-14 ENCOUNTER — Ambulatory Visit: Payer: BC Managed Care – PPO | Admitting: Family

## 2021-11-14 ENCOUNTER — Telehealth: Payer: Self-pay | Admitting: *Deleted

## 2021-11-14 NOTE — Telephone Encounter (Signed)
PLEASE NOTE: All timestamps contained within this report are represented as [place] Standard Time. CONFIDENTIALTY NOTICE: This fax transmission is intended only for the addressee. It contains information that is legally privileged, confidential or otherwise protected from use or disclosure. If you are not the intended recipient, you are strictly prohibited from reviewing, disclosing, copying using or disseminating any of this information or taking any action in reliance on or regarding this information. If you have received this fax in error, please notify us immediately by telephone so that we can arrange for its return to [place]. Phone: [phone], Toll-Free: [phone], Fax: [phone] Page: 1 of 1 Call Id: [id number] [place] Night - Client Nonclinical Telephone Record  [facility] Client [place] Night - Client Client Site [place] Provider Alma Friendly - NP Contact Type Call Who Is Calling Patient / Member / Family / Caregiver Caller Name Rachel Swanson Caller Phone Number [phone] Patient Name Rachel Swanson Patient DOB 1976/08/23 Call Type Message Only Information Provided Reason for Call Request to [facility] Appointment Initial Comment Caller states has appointment this morning and needs to cancel, is feeling better. Patient request to speak to RN No Disp. Time Disposition Final User 11/14/2021 7:57:44 AM General Information Provided Yes Fran Lowes Call Closed By: Fran Lowes Transaction Date/Time: 11/14/2021 7:53:53 AM (ET)

## 2021-11-24 ENCOUNTER — Other Ambulatory Visit: Payer: Self-pay | Admitting: General Surgery

## 2021-11-24 ENCOUNTER — Other Ambulatory Visit (HOSPITAL_COMMUNITY): Payer: Self-pay | Admitting: General Surgery

## 2021-11-24 ENCOUNTER — Other Ambulatory Visit: Payer: Self-pay | Admitting: Primary Care

## 2021-11-24 DIAGNOSIS — K219 Gastro-esophageal reflux disease without esophagitis: Secondary | ICD-10-CM

## 2021-11-24 DIAGNOSIS — R7303 Prediabetes: Secondary | ICD-10-CM

## 2021-11-24 DIAGNOSIS — J302 Other seasonal allergic rhinitis: Secondary | ICD-10-CM

## 2021-11-25 NOTE — Telephone Encounter (Signed)
Support Pool: ? ?Patient is due for CPE/general follow up. ?Please schedule for any day, anytime, any slot. ? ?

## 2021-12-02 ENCOUNTER — Other Ambulatory Visit: Payer: Self-pay

## 2021-12-02 ENCOUNTER — Ambulatory Visit (HOSPITAL_COMMUNITY)
Admission: RE | Admit: 2021-12-02 | Discharge: 2021-12-02 | Disposition: A | Discharge status: Home / Self Care | Payer: BC Managed Care – PPO | Source: Ambulatory Visit | Attending: General Surgery | Admitting: General Surgery

## 2021-12-02 DIAGNOSIS — K219 Gastro-esophageal reflux disease without esophagitis: Secondary | ICD-10-CM | POA: Insufficient documentation

## 2021-12-02 DIAGNOSIS — R7303 Prediabetes: Secondary | ICD-10-CM

## 2021-12-17 ENCOUNTER — Other Ambulatory Visit: Payer: Self-pay | Admitting: Primary Care

## 2021-12-17 DIAGNOSIS — J302 Other seasonal allergic rhinitis: Secondary | ICD-10-CM

## 2021-12-30 NOTE — Telephone Encounter (Signed)
Left message on VM per DPR to call the office next week to set up CPE with Anda Kraft. ?

## 2022-01-02 ENCOUNTER — Encounter: Payer: BC Managed Care – PPO | Attending: General Surgery | Admitting: Skilled Nursing Facility1

## 2022-01-02 DIAGNOSIS — I10 Essential (primary) hypertension: Secondary | ICD-10-CM | POA: Diagnosis not present

## 2022-01-02 DIAGNOSIS — R7303 Prediabetes: Secondary | ICD-10-CM | POA: Insufficient documentation

## 2022-01-02 DIAGNOSIS — R5383 Other fatigue: Secondary | ICD-10-CM | POA: Insufficient documentation

## 2022-01-02 DIAGNOSIS — Z6841 Body Mass Index (BMI) 40.0 and over, adult: Secondary | ICD-10-CM | POA: Insufficient documentation

## 2022-01-02 DIAGNOSIS — K219 Gastro-esophageal reflux disease without esophagitis: Secondary | ICD-10-CM | POA: Insufficient documentation

## 2022-01-02 DIAGNOSIS — E669 Obesity, unspecified: Secondary | ICD-10-CM

## 2022-01-02 DIAGNOSIS — Z713 Dietary counseling and surveillance: Secondary | ICD-10-CM | POA: Insufficient documentation

## 2022-01-04 NOTE — Progress Notes (Signed)
Pre-Operative Nutrition Class:   ? ?Patient was seen on 01/02/2022 for Pre-Operative Bariatric Surgery Education at [facility]   ? ?Surgery date: not scheduled ?Surgery type:  ?Start weight at [facility]: 279.8 ?Weight today: 280.5 ? ?Samples given per MNT protocol. Patient educated on appropriate usage: ?Bariatric Advantage Multivitamin ?Lot #[id number] ?Exp:10/23 ?  ?Bariatric Advantage Calcium  ?Lot #[id number] ?Exp:07/31/2022 ?  ?Protein Shake Ensure Max ?Lot # [id number] ?Exp: 10/26/2022 ?  ?Bariatric Advantage Protein Powder ?Lot # [id number] ?Exp: 02/24 ? ?The following the learning objectives were met by the patient during this course: ?Identify Pre-Op Dietary Goals and will begin 2 weeks pre-operatively ?Identify appropriate sources of fluids and proteins  ?State protein recommendations and appropriate sources pre and post-operatively ?Identify Post-Operative Dietary Goals and will follow for 2 weeks post-operatively ?Identify appropriate multivitamin and calcium sources ?Describe the need for physical activity post-operatively and will follow MD recommendations ?State when to call healthcare provider regarding medication questions or post-operative complications ?When having a diagnosis of diabetes understanding hypoglycemia symptoms and the inclusion of 1 complex carbohydrate per meal ? ?Handouts given during class include: ?Pre-Op Bariatric Surgery Diet Handout ?Protein Shake Handout ?Post-Op Bariatric Surgery Nutrition Handout ?BELT Program Information Flyer ?Support Group Information Flyer ?[facility] Bariatric Supplements Price List ? ?Follow-Up Plan: ?Patient will follow-up at [facility] 2 weeks post operatively for diet advancement per MD.  ?

## 2022-01-10 ENCOUNTER — Other Ambulatory Visit: Payer: Self-pay | Admitting: Primary Care

## 2022-01-10 DIAGNOSIS — J302 Other seasonal allergic rhinitis: Secondary | ICD-10-CM

## 2022-01-15 ENCOUNTER — Other Ambulatory Visit: Payer: Self-pay | Admitting: Primary Care

## 2022-01-15 DIAGNOSIS — J3089 Other allergic rhinitis: Secondary | ICD-10-CM

## 2022-01-18 ENCOUNTER — Other Ambulatory Visit: Payer: Self-pay | Admitting: Primary Care

## 2022-01-18 DIAGNOSIS — J3089 Other allergic rhinitis: Secondary | ICD-10-CM

## 2022-01-27 ENCOUNTER — Ambulatory Visit (INDEPENDENT_AMBULATORY_CARE_PROVIDER_SITE_OTHER): Payer: BC Managed Care – PPO | Admitting: Primary Care

## 2022-01-27 ENCOUNTER — Encounter: Payer: Self-pay | Admitting: Primary Care

## 2022-01-27 VITALS — BP 118/80 | HR 86 | Ht 64.2 in | Wt 278.2 lb

## 2022-01-27 DIAGNOSIS — K219 Gastro-esophageal reflux disease without esophagitis: Secondary | ICD-10-CM

## 2022-01-27 DIAGNOSIS — J302 Other seasonal allergic rhinitis: Secondary | ICD-10-CM

## 2022-01-27 DIAGNOSIS — I10 Essential (primary) hypertension: Secondary | ICD-10-CM | POA: Diagnosis not present

## 2022-01-27 DIAGNOSIS — Z0000 Encounter for general adult medical examination without abnormal findings: Secondary | ICD-10-CM | POA: Diagnosis not present

## 2022-01-27 DIAGNOSIS — R87610 Atypical squamous cells of undetermined significance on cytologic smear of cervix (ASC-US): Secondary | ICD-10-CM

## 2022-01-27 DIAGNOSIS — R7303 Prediabetes: Secondary | ICD-10-CM | POA: Diagnosis not present

## 2022-01-27 DIAGNOSIS — R87810 Cervical high risk human papillomavirus (HPV) DNA test positive: Secondary | ICD-10-CM

## 2022-01-27 DIAGNOSIS — Z6841 Body Mass Index (BMI) 40.0 and over, adult: Secondary | ICD-10-CM

## 2022-01-27 DIAGNOSIS — R053 Chronic cough: Secondary | ICD-10-CM

## 2022-01-27 LAB — COMPREHENSIVE METABOLIC PANEL
ALT: 12 U/L (ref 0–35)
AST: 13 U/L (ref 0–37)
Albumin: 3.7 g/dL (ref 3.5–5.2)
Alkaline Phosphatase: 59 U/L (ref 39–117)
BUN: 14 mg/dL (ref 6–23)
CO2: 29 mEq/L (ref 19–32)
Calcium: 9.3 mg/dL (ref 8.4–10.5)
Chloride: 102 mEq/L (ref 96–112)
Creatinine, Ser: 0.87 mg/dL (ref 0.40–1.20)
GFR: 78.85 mL/min (ref >60.00)
Glucose, Bld: 88 mg/dL (ref 70–99)
Potassium: 4.1 mEq/L (ref 3.5–5.1)
Sodium: 139 mEq/L (ref 135–145)
Total Bilirubin: 0.7 mg/dL (ref 0.2–1.2)
Total Protein: 6.9 g/dL (ref 6.0–8.3)

## 2022-01-27 LAB — LIPID PANEL
Cholesterol: 176 mg/dL (ref 0–200)
HDL: 43.3 mg/dL (ref >39.00)
LDL Cholesterol: 116 mg/dL — ABNORMAL HIGH (ref 0–99)
NonHDL: 132.5
Total CHOL/HDL Ratio: 4
Triglycerides: 83 mg/dL (ref 0.0–149.0)
VLDL: 16.6 mg/dL (ref 0.0–40.0)

## 2022-01-27 LAB — T4, FREE: Free T4: 1.29 ng/dL (ref 0.60–1.60)

## 2022-01-27 LAB — TSH: TSH: 1.64 u[IU]/mL (ref 0.35–5.50)

## 2022-01-27 LAB — HEMOGLOBIN A1C: Hgb A1c MFr Bld: 6.2 % (ref 4.6–6.5)

## 2022-01-27 NOTE — Assessment & Plan Note (Signed)
Following with [facility], will undergo gastric bypass in June 2023. ? ? ?

## 2022-01-27 NOTE — Assessment & Plan Note (Signed)
Repeat A1c pending. ? ?Discussed the importance of a healthy diet and regular exercise in order for weight loss, and to reduce the risk of further co-morbidity. ? ?

## 2022-01-27 NOTE — Progress Notes (Signed)
? ?Subjective:  ? ? Patient ID: Rachel Swanson, female    DOB: Sep 20, 1976, 46 y.o.   MRN: [id number] ? ?HPI ? ?Rachel Swanson is a very pleasant 46 y.o. female who presents today for complete physical and follow up of chronic conditions. ? ?She saw a homeopathic doctor in December 2022 for ongoing hair loss. She underwent lab work and was told that her "thyroid was a little low" and was prescribed liothyronine 5 mg and took for a short period of time. She has a family history of hypothyroidism in her mother.  ? ?Immunizations: ?-Tetanus: 2016 ?-Influenza: Completed last season ?-Covid-19: 3 vaccines ? ?Diet: Fair diet.  ?Exercise: No regular exercise. ? ?Eye exam: Completes annually  ?Dental exam: Completes semi-annually  ? ?Pap Smear: Completed in 2022, follows with GYN.  ?Mammogram: Completed in September 2022 ?Colonoscopy: Completed in 2021, due 2028 ? ?BP Readings from Last 3 Encounters:  ?01/27/22 118/80  ?08/09/21 131/85  ?06/23/21 138/85  ? ? ? ? ? ?Review of Systems  ?Constitutional:  Negative for unexpected weight change.  ?HENT:  Negative for rhinorrhea.   ?Eyes:  Negative for visual disturbance.  ?Respiratory:  Negative for cough and shortness of breath.   ?Cardiovascular:  Negative for chest pain.  ?Gastrointestinal:  Negative for constipation and diarrhea.  ?Genitourinary:  Negative for difficulty urinating.  ?Musculoskeletal:  Negative for arthralgias and myalgias.  ?Skin:  Negative for rash.  ?Allergic/Immunologic: Positive for environmental allergies.  ?Neurological:  Negative for dizziness and headaches.  ?Psychiatric/Behavioral:  The patient is not nervous/anxious.   ? ?   ? ? ?Past Medical History:  ?Diagnosis Date  ? GERD (gastroesophageal reflux disease)   ? Hypertension   ? Prediabetes   ? Seasonal allergies   ? ? ?Social History  ? ?Socioeconomic History  ? Marital status: Single  ?  Spouse name: Not on file  ? Number of children: 0  ? Years of education: Not on file  ? Highest  education level: Not on file  ?Occupational History  ? Occupation: Pharmacist, hospital  ?Tobacco Use  ? Smoking status: Never  ? Smokeless tobacco: Never  ?Vaping Use  ? Vaping Use: Never used  ?Substance and Sexual Activity  ? Alcohol use: Yes  ?  Comment: 1-2 per month  ? Drug use: No  ? Sexual activity: Yes  ?  Partners: Male  ?  Birth control/protection: Condom  ?Other Topics Concern  ? Not on file  ?Social History Narrative  ? Single.  ? Teacher at [employer].  ? Enjoys exercising, visiting with her niece.   ? ?Social Determinants of Health  ? ?Financial Resource Strain: Not on file  ?Food Insecurity: Not on file  ?Transportation Needs: Not on file  ?Physical Activity: Not on file  ?Stress: Not on file  ?Social Connections: Not on file  ?Intimate Partner Violence: Not on file  ? ? ?Past Surgical History:  ?Procedure Laterality Date  ? BUNIONECTOMY Right   ? WISDOM TOOTH EXTRACTION    ? ? ?Family History  ?Problem Relation Age of Onset  ? Heart disease Mother   ? Hypertension Mother   ? Hyperlipidemia Father   ? Heart attack Father   ? Diabetes Sister   ? Breast cancer Paternal Aunt   ? Breast cancer Cousin   ?     paternal  ? Colon cancer Neg Hx   ? Esophageal cancer Neg Hx   ? Rectal cancer Neg Hx   ? Stomach cancer Neg Hx   ? ? ?  Allergies  ?Allergen Reactions  ? Ace Inhibitors Cough  ? ? ?Current Outpatient Medications on File Prior to Visit  ?Medication Sig Dispense Refill  ? diclofenac Sodium (VOLTAREN) 1 % GEL Apply 2 g topically 3 (three) times daily as needed (pain). 100 g 2  ? fluticasone (FLONASE) 50 MCG/ACT nasal spray PLACE 1 SPRAY INTO BOTH NOSTRILS 2 (TWO) TIMES DAILY. FOR ALLERGIES. 48 mL 0  ? hydrochlorothiazide (HYDRODIURIL) 12.5 MG tablet Take 1 tablet (12.5 mg total) by mouth daily. For blood pressure. 90 tablet 3  ? levocetirizine (XYZAL) 5 MG tablet TAKE 1 TABLET (5 MG TOTAL) BY MOUTH EVERY EVENING. FOR COUGH/ALLERGIES 90 tablet 0  ? montelukast (SINGULAIR) 10 MG tablet TAKE 1 TABLET (10 MG TOTAL) BY  MOUTH AT BEDTIME. FOR ALLERGIES. 90 tablet 0  ? olmesartan (BENICAR) 40 MG tablet TAKE 1 TABLET (40 MG TOTAL) BY MOUTH DAILY. FOR BLOOD PRESSURE. 90 tablet 2  ? omeprazole (PRILOSEC) 20 MG capsule Take 1 capsule (20 mg total) by mouth daily. For heartburn. 90 capsule 3  ? ?No current facility-administered medications on file prior to visit.  ? ? ?BP 118/80   Pulse 86   Ht 5' 4.2" (1.631 m)   Wt 278 lb 3.2 oz (126.2 kg)   LMP 01/18/2022   SpO2 98%   BMI 47.46 kg/m?  ?Objective:  ? Physical Exam ?HENT:  ?   Right Ear: Tympanic membrane and ear canal normal.  ?   Left Ear: Tympanic membrane and ear canal normal.  ?   Nose: Nose normal.  ?Eyes:  ?   Conjunctiva/sclera: Conjunctivae normal.  ?   Pupils: Pupils are equal, round, and reactive to light.  ?Neck:  ?   Thyroid: No thyromegaly.  ?Cardiovascular:  ?   Rate and Rhythm: Normal rate and regular rhythm.  ?   Heart sounds: No murmur heard. ?Pulmonary:  ?   Effort: Pulmonary effort is normal.  ?   Breath sounds: Normal breath sounds. No rales.  ?Abdominal:  ?   General: Bowel sounds are normal.  ?   Palpations: Abdomen is soft.  ?   Tenderness: There is no abdominal tenderness.  ?Musculoskeletal:     ?   General: Normal range of motion.  ?   Cervical back: Neck supple.  ?Lymphadenopathy:  ?   Cervical: No cervical adenopathy.  ?Skin: ?   General: Skin is warm and dry.  ?   Findings: No rash.  ?Neurological:  ?   Mental Status: She is alert and oriented to person, place, and time.  ?   Cranial Nerves: No cranial nerve deficit.  ?   Deep Tendon Reflexes: Reflexes are normal and symmetric.  ?Psychiatric:     ?   Mood and Affect: Mood normal.  ? ? ? ? ? ?   ?Assessment & Plan:  ? ? ? ? ?This visit occurred during the SARS-CoV-2 public health emergency.  Safety protocols were in place, including screening questions prior to the visit, additional usage of staff PPE, and extensive cleaning of exam room while observing appropriate contact time as indicated for  disinfecting solutions.  ?

## 2022-01-27 NOTE — Assessment & Plan Note (Signed)
Reviewed Pap smear from September 2022, follows with GYN. ?

## 2022-01-27 NOTE — Assessment & Plan Note (Addendum)
Controlled. ? ?Continue montelukast 10 mg daily, levocetirizine 5 mg daily, Flonase as needed. ?

## 2022-01-27 NOTE — Assessment & Plan Note (Signed)
Controlled. ? ?Continue omeprazole 20 mg daily. ?She will be undergo bariatric surgery this summer which may help with symptoms.  ?

## 2022-01-27 NOTE — Patient Instructions (Addendum)
Stop by the lab prior to leaving today. I will notify you of your results once received.   It was a pleasure to see you today!  Preventive Care 40-48 Years Old, Female Preventive care refers to lifestyle choices and visits with your health care provider that can promote health and wellness. Preventive care visits are also called wellness exams. What can I expect for my preventive care visit? Counseling Your health care provider may ask you questions about your: Medical history, including: Past medical problems. Family medical history. Pregnancy history. Current health, including: Menstrual cycle. Method of birth control. Emotional well-being. Home life and relationship well-being. Sexual activity and sexual health. Lifestyle, including: Alcohol, nicotine or tobacco, and drug use. Access to firearms. Diet, exercise, and sleep habits. Work and work environment. Sunscreen use. Safety issues such as seatbelt and bike helmet use. Physical exam Your health care provider will check your: Height and weight. These may be used to calculate your BMI (body mass index). BMI is a measurement that tells if you are at a healthy weight. Waist circumference. This measures the distance around your waistline. This measurement also tells if you are at a healthy weight and may help predict your risk of certain diseases, such as type 2 diabetes and high blood pressure. Heart rate and blood pressure. Body temperature. Skin for abnormal spots. What immunizations do I need?  Vaccines are usually given at various ages, according to a schedule. Your health care provider will recommend vaccines for you based on your age, medical history, and lifestyle or other factors, such as travel or where you work. What tests do I need? Screening Your health care provider may recommend screening tests for certain conditions. This may include: Lipid and cholesterol levels. Diabetes screening. This is done by checking  your blood sugar (glucose) after you have not eaten for a while (fasting). Pelvic exam and Pap test. Hepatitis B test. Hepatitis C test. HIV (human immunodeficiency virus) test. STI (sexually transmitted infection) testing, if you are at risk. Lung cancer screening. Colorectal cancer screening. Mammogram. Talk with your health care provider about when you should start having regular mammograms. This may depend on whether you have a family history of breast cancer. BRCA-related cancer screening. This may be done if you have a family history of breast, ovarian, tubal, or peritoneal cancers. Bone density scan. This is done to screen for osteoporosis. Talk with your health care provider about your test results, treatment options, and if necessary, the need for more tests. Follow these instructions at home: Eating and drinking  Eat a diet that includes fresh fruits and vegetables, whole grains, lean protein, and low-fat dairy products. Take vitamin and mineral supplements as recommended by your health care provider. Do not drink alcohol if: Your health care provider tells you not to drink. You are pregnant, may be pregnant, or are planning to become pregnant. If you drink alcohol: Limit how much you have to 0-1 drink a day. Know how much alcohol is in your drink. In the [place] one drink equals one 12 oz bottle of beer (355 mL), one 5 oz glass of wine (148 mL), or one 1 oz glass of hard liquor (44 mL). Lifestyle Brush your teeth every morning and night with fluoride toothpaste. Floss one time each day. Exercise for at least 30 minutes 5 or more days each week. Do not use any products that contain nicotine or tobacco. These products include cigarettes, chewing tobacco, and vaping devices, such as e-cigarettes. If you need   help quitting, ask your health care provider. Do not use drugs. If you are sexually active, practice safe sex. Use a condom or other form of protection to prevent STIs. If you  do not wish to become pregnant, use a form of birth control. If you plan to become pregnant, see your health care provider for a prepregnancy visit. Take aspirin only as told by your health care provider. Make sure that you understand how much to take and what form to take. Work with your health care provider to find out whether it is safe and beneficial for you to take aspirin daily. Find healthy ways to manage stress, such as: Meditation, yoga, or listening to music. Journaling. Talking to a trusted person. Spending time with friends and family. Minimize exposure to UV radiation to reduce your risk of skin cancer. Safety Always wear your seat belt while driving or riding in a vehicle. Do not drive: If you have been drinking alcohol. Do not ride with someone who has been drinking. When you are tired or distracted. While texting. If you have been using any mind-altering substances or drugs. Wear a helmet and other protective equipment during sports activities. If you have firearms in your house, make sure you follow all gun safety procedures. Seek help if you have been physically or sexually abused. What's next? Visit your health care provider once a year for an annual wellness visit. Ask your health care provider how often you should have your eyes and teeth checked. Stay up to date on all vaccines. This information is not intended to replace advice given to you by your health care provider. Make sure you discuss any questions you have with your health care provider. Document Revised: 03/09/2021 Document Reviewed: 03/09/2021 Elsevier Patient Education  2023 [employer].  

## 2022-01-27 NOTE — Assessment & Plan Note (Signed)
Significant improvement! ? ?Continue montelukast 10 mg nightly, Xyzal 5 mg daily, Flonase as needed, omeprazole 20 mg daily. ?

## 2022-01-27 NOTE — Assessment & Plan Note (Signed)
Immunizations up-to-date. ?Pap smear up-to-date, follows with GYN. ?Colonoscopy up-to-date, due in 2028 ? ?Discussed the importance of a healthy diet and regular exercise in order for weight loss, and to reduce the risk of further co-morbidity. ? ?Exam today stable, labs pending ?

## 2022-01-27 NOTE — Assessment & Plan Note (Signed)
Controlled. ? ?Continue HCTZ 12.5 mg daily, olmesartan 40 mg daily. ?CMP pending. ?

## 2022-03-02 NOTE — Progress Notes (Signed)
Sent message, via epic in basket, requesting order in epic from surgeon     03/02/22 1007  Preop Orders  Has preop orders? No  Name of staff/physician contacted for orders(Indicate phone or IB message) E. Redmond Pulling, MD

## 2022-03-03 ENCOUNTER — Other Ambulatory Visit (HOSPITAL_COMMUNITY): Payer: Self-pay

## 2022-03-03 ENCOUNTER — Ambulatory Visit: Payer: Self-pay | Admitting: General Surgery

## 2022-03-03 DIAGNOSIS — I10 Essential (primary) hypertension: Secondary | ICD-10-CM

## 2022-03-08 NOTE — Patient Instructions (Signed)
DUE TO COVID-19 ONLY TWO VISITORS  (aged 48 and older)  ARE ALLOWED TO COME WITH YOU AND STAY IN THE WAITING ROOM ONLY DURING PRE OP AND PROCEDURE.   **NO VISITORS ARE ALLOWED IN THE SHORT STAY AREA OR RECOVERY ROOM!!**  IF YOU WILL BE ADMITTED INTO THE HOSPITAL YOU ARE ALLOWED ONLY FOUR SUPPORT PEOPLE DURING VISITATION HOURS ONLY (7 AM -8PM)   The support person(s) must pass our screening, gel in and out, and wear a mask at all times, including in the patient's room. Patients must also wear a mask when staff or their support person are in the room. Visitors GUEST BADGE MUST BE WORN VISIBLY  One adult visitor may remain with you overnight and MUST be in the room by 8 P.M.     Your procedure is scheduled on: 03/13/22   Report to [facility] Main Entrance    Report to admitting at : 8:30 AM   Call this number if you have problems the morning of surgery [phone]   MORNING OF SURGERY DRINK:   DRINK 1 G2 drink BEFORE YOU LEAVE HOME, DRINK ALL OF THE  G2 DRINK AT ONE TIME.   NO SOLID FOOD AFTER 600 PM THE NIGHT BEFORE YOUR SURGERY. YOU MAY DRINK CLEAR FLUIDS. THE G2 DRINK YOU DRINK BEFORE YOU LEAVE HOME WILL BE THE LAST FLUIDS YOU DRINK BEFORE SURGERY.  PAIN IS EXPECTED AFTER SURGERY AND WILL NOT BE COMPLETELY ELIMINATED. AMBULATION AND TYLENOL WILL HELP REDUCE INCISIONAL AND GAS PAIN. MOVEMENT IS KEY!  YOU ARE EXPECTED TO BE OUT OF BED WITHIN 4 HOURS OF ADMISSION TO YOUR PATIENT ROOM.  SITTING IN THE RECLINER THROUGHOUT THE DAY IS IMPORTANT FOR DRINKING FLUIDS AND MOVING GAS THROUGHOUT THE GI TRACT.  COMPRESSION STOCKINGS SHOULD BE WORN Drain UNLESS YOU ARE WALKING.   INCENTIVE SPIROMETER SHOULD BE USED EVERY HOUR WHILE AWAKE TO DECREASE POST-OPERATIVE COMPLICATIONS SUCH AS PNEUMONIA.  WHEN DISCHARGED HOME, IT IS IMPORTANT TO CONTINUE TO WALK EVERY HOUR AND USE THE INCENTIVE SPIROMETER EVERY HOUR.   After Midnight you may have the following liquids  until : 7:30 AM DAY OF SURGERY  Water Black Coffee (sugar ok, NO MILK/CREAM OR CREAMERS)  Tea (sugar ok, NO MILK/CREAM OR CREAMERS) regular and decaf                             Plain Jell-O (NO RED)                                           Fruit ices (not with fruit pulp, NO RED)                                     Popsicles (NO RED)                                                                  Juice: apple, WHITE grape, WHITE cranberry Sports drinks like Gatorade (NO RED) Clear broth(vegetable,chicken,beef)     Drink  G2 drink  AT : 7:30 AM the day of surgery surgery.      The day of surgery:  Drink ONE (1) Pre-Surgery Clear Ensure or G2 at AM the morning of surgery. Drink in one sitting. Do not sip.  This drink was given to you during your hospital  pre-op appointment visit. Nothing else to drink after completing the  Pre-Surgery Clear Ensure or G2.          If you have questions, please contact your surgeon's office.    Oral Hygiene is also important to reduce your risk of infection.                                    Remember - BRUSH YOUR TEETH THE MORNING OF SURGERY WITH YOUR REGULAR TOOTHPASTE   Do NOT smoke after Midnight   Take these medicines the morning of surgery with A SIP OF WATER:   DO NOT TAKE ANY ORAL DIABETIC MEDICATIONS DAY OF YOUR SURGERY  Bring CPAP mask and tubing day of surgery.                              You may not have any metal on your body including hair pins, jewelry, and body piercing             Do not wear make-up, lotions, powders, perfumes/cologne, or deodorant  Do not wear nail polish including gel and S&S, artificial/acrylic nails, or any other type of covering on natural nails including finger and toenails. If you have artificial nails, gel coating, etc. that needs to be removed by a nail salon please have this removed prior to surgery or surgery may need to be canceled/ delayed if the surgeon/ anesthesia feels like they are unable to be  safely monitored.   Do not shave  48 hours prior to surgery.    Do not bring valuables to the hospital. [place].   Contacts, dentures or bridgework may not be worn into surgery.   Bring small overnight bag day of surgery.   DO NOT [place]. PHARMACY WILL DISPENSE MEDICATIONS LISTED ON YOUR MEDICATION LIST TO YOU DURING YOUR ADMISSION [place]!    Patients discharged on the day of surgery will not be allowed to drive home.  Someone NEEDS to stay with you for the first 24 hours after anesthesia.   Special Instructions: Bring a copy of your healthcare power of attorney and living will documents         the day of surgery if you haven't scanned them before.              Please read over the following fact sheets you were given: IF YOU HAVE QUESTIONS ABOUT YOUR PRE-OP INSTRUCTIONS PLEASE CALL [phone]     [facility] - Preparing for Surgery Before surgery, you can play an important role.  Because skin is not sterile, your skin needs to be as free of germs as possible.  You can reduce the number of germs on your skin by washing with CHG (chlorahexidine gluconate) soap before surgery.  CHG is an antiseptic cleaner which kills germs and bonds with the skin to continue killing germs even after washing. Please DO NOT use if you  have an allergy to CHG or antibacterial soaps.  If your skin becomes reddened/irritated stop using the CHG and inform your nurse when you arrive at Short Stay. Do not shave (including legs and underarms) for at least 48 hours prior to the first CHG shower.  You may shave your face/neck. Please follow these instructions carefully:  1.  Shower with CHG Soap the night before surgery and the  morning of Surgery.  2.  If you choose to wash your hair, wash your hair first as usual with your  normal  shampoo.  3.  After you shampoo, rinse your hair and body thoroughly to remove the   shampoo.                           4.  Use CHG as you would any other liquid soap.  You can apply chg directly  to the skin and wash                       Gently with a scrungie or clean washcloth.  5.  Apply the CHG Soap to your body ONLY FROM THE NECK DOWN.   Do not use on face/ open                           Wound or open sores. Avoid contact with eyes, ears mouth and genitals (private parts).                       Wash face,  Genitals (private parts) with your normal soap.             6.  Wash thoroughly, paying special attention to the area where your surgery  will be performed.  7.  Thoroughly rinse your body with warm water from the neck down.  8.  DO NOT shower/wash with your normal soap after using and rinsing off  the CHG Soap.                9.  Pat yourself dry with a clean towel.            10.  Wear clean pajamas.            11.  Place clean sheets on your bed the night of your first shower and do not  sleep with pets. Day of Surgery : Do not apply any lotions/deodorants the morning of surgery.  Please wear clean clothes to the hospital/surgery center.  FAILURE TO FOLLOW THESE INSTRUCTIONS MAY RESULT IN THE CANCELLATION OF YOUR SURGERY PATIENT SIGNATURE_________________________________  NURSE SIGNATURE__________________________________  ________________________________________________________________________

## 2022-03-09 ENCOUNTER — Encounter (HOSPITAL_COMMUNITY): Payer: Self-pay

## 2022-03-09 ENCOUNTER — Other Ambulatory Visit: Payer: Self-pay

## 2022-03-09 ENCOUNTER — Encounter (HOSPITAL_COMMUNITY)
Admission: RE | Admit: 2022-03-09 | Discharge: 2022-03-09 | Disposition: A | Discharge status: Home / Self Care | Payer: BC Managed Care – PPO | Source: Ambulatory Visit | Attending: General Surgery | Admitting: General Surgery

## 2022-03-09 VITALS — BP 152/100 | HR 83 | Temp 98.6°F | Resp 18 | Ht 65.0 in | Wt 275.5 lb

## 2022-03-09 DIAGNOSIS — Z01812 Encounter for preprocedural laboratory examination: Secondary | ICD-10-CM | POA: Diagnosis not present

## 2022-03-09 DIAGNOSIS — R7303 Prediabetes: Secondary | ICD-10-CM

## 2022-03-09 DIAGNOSIS — I10 Essential (primary) hypertension: Secondary | ICD-10-CM | POA: Diagnosis not present

## 2022-03-09 HISTORY — DX: Prediabetes: R73.03

## 2022-03-09 LAB — COMPREHENSIVE METABOLIC PANEL
ALT: 17 U/L (ref 0–44)
AST: 16 U/L (ref 15–41)
Albumin: 3.8 g/dL (ref 3.5–5.0)
Alkaline Phosphatase: 64 U/L (ref 38–126)
Anion gap: 7 (ref 5–15)
BUN: 12 mg/dL (ref 6–20)
CO2: 26 mmol/L (ref 22–32)
Calcium: 9.6 mg/dL (ref 8.9–10.3)
Chloride: 106 mmol/L (ref 98–111)
Creatinine, Ser: 0.92 mg/dL (ref 0.44–1.00)
GFR, Estimated: >60 mL/min (ref >60)
Glucose, Bld: 98 mg/dL (ref 70–99)
Potassium: 3.4 mmol/L — ABNORMAL LOW (ref 3.5–5.1)
Sodium: 139 mmol/L (ref 135–145)
Total Bilirubin: 0.8 mg/dL (ref 0.3–1.2)
Total Protein: 7.8 g/dL (ref 6.5–8.1)

## 2022-03-09 LAB — CBC WITH DIFFERENTIAL/PLATELET
Abs Immature Granulocytes: 0.01 10*3/uL (ref 0.00–0.07)
Basophils Absolute: 0 10*3/uL (ref 0.0–0.1)
Basophils Relative: 1 %
Eosinophils Absolute: 0.2 10*3/uL (ref 0.0–0.5)
Eosinophils Relative: 3 %
HCT: 39.7 % (ref 36.0–46.0)
Hemoglobin: 12.9 g/dL (ref 12.0–15.0)
Immature Granulocytes: 0 %
Lymphocytes Relative: 37 %
Lymphs Abs: 2.1 10*3/uL (ref 0.7–4.0)
MCH: 29.2 pg (ref 26.0–34.0)
MCHC: 32.5 g/dL (ref 30.0–36.0)
MCV: 89.8 fL (ref 80.0–100.0)
Monocytes Absolute: 0.5 10*3/uL (ref 0.1–1.0)
Monocytes Relative: 8 %
Neutro Abs: 2.9 10*3/uL (ref 1.7–7.7)
Neutrophils Relative %: 51 %
Platelets: 278 10*3/uL (ref 150–400)
RBC: 4.42 MIL/uL (ref 3.87–5.11)
RDW: 13.8 % (ref 11.5–15.5)
WBC: 5.7 10*3/uL (ref 4.0–10.5)
nRBC: 0 % (ref 0.0–0.2)

## 2022-03-09 LAB — GLUCOSE, CAPILLARY: Glucose-Capillary: 101 mg/dL — ABNORMAL HIGH (ref 70–99)

## 2022-03-09 LAB — HEMOGLOBIN A1C
Hgb A1c MFr Bld: 5.7 % — ABNORMAL HIGH (ref 4.8–5.6)
Mean Plasma Glucose: 116.89 mg/dL

## 2022-03-09 NOTE — Progress Notes (Addendum)
For Short Stay: [place] appointment date: Date of COVID positive in last 32 days:  Bowel Prep reminder:   For Anesthesia: PCP - Alma Friendly : NP Cardiologist -   Chest x-ray - 12/02/21 EKG - 12/02/21 Stress Test -  ECHO -  Cardiac Cath -  Pacemaker/ICD device last checked: Pacemaker orders received: Device Rep notified:  Spinal Cord Stimulator:  Sleep Study -  CPAP -   Fasting Blood Sugar -  Checks Blood Sugar _____ times a day Date and result of last Hgb A1c-  Blood Thinner Instructions: Aspirin Instructions: Last Dose:  Activity level: Can go up a flight of stairs and activities of daily living without stopping and without chest pain and/or shortness of breath   Able to exercise without chest pain and/or shortness of breath   Unable to go up a flight of stairs without chest pain and/or shortness of breath     Anesthesia review: BP was elevated during PAT,Jessica PAC was notified,pt. Was advised to keep checking BP at home,if still high to reach over PCP.  Patient denies shortness of breath, fever, cough and chest pain at PAT appointment   Patient verbalized understanding of instructions that were given to them at the PAT appointment. Patient was also instructed that they will need to review over the PAT instructions again at home before surgery.

## 2022-03-13 ENCOUNTER — Inpatient Hospital Stay (HOSPITAL_COMMUNITY)
Admission: RE | Admit: 2022-03-13 | Discharge: 2022-03-14 | DRG: 621 | Disposition: A | Discharge status: Home / Self Care | Payer: BC Managed Care – PPO | Attending: General Surgery | Admitting: General Surgery

## 2022-03-13 ENCOUNTER — Other Ambulatory Visit: Payer: Self-pay

## 2022-03-13 ENCOUNTER — Inpatient Hospital Stay (HOSPITAL_COMMUNITY): Payer: BC Managed Care – PPO | Admitting: Physician Assistant

## 2022-03-13 ENCOUNTER — Inpatient Hospital Stay (HOSPITAL_COMMUNITY): Payer: BC Managed Care – PPO | Admitting: Anesthesiology

## 2022-03-13 ENCOUNTER — Encounter (HOSPITAL_COMMUNITY): Payer: Self-pay | Admitting: General Surgery

## 2022-03-13 ENCOUNTER — Encounter (HOSPITAL_COMMUNITY)
Admission: RE | Disposition: A | Discharge status: Home / Self Care | Payer: Self-pay | Source: Home / Self Care | Attending: General Surgery

## 2022-03-13 DIAGNOSIS — K449 Diaphragmatic hernia without obstruction or gangrene: Secondary | ICD-10-CM | POA: Diagnosis present

## 2022-03-13 DIAGNOSIS — R7303 Prediabetes: Secondary | ICD-10-CM | POA: Diagnosis present

## 2022-03-13 DIAGNOSIS — Z79899 Other long term (current) drug therapy: Secondary | ICD-10-CM | POA: Diagnosis not present

## 2022-03-13 DIAGNOSIS — Z6841 Body Mass Index (BMI) 40.0 and over, adult: Secondary | ICD-10-CM

## 2022-03-13 DIAGNOSIS — G8929 Other chronic pain: Secondary | ICD-10-CM | POA: Diagnosis present

## 2022-03-13 DIAGNOSIS — Z8249 Family history of ischemic heart disease and other diseases of the circulatory system: Secondary | ICD-10-CM

## 2022-03-13 DIAGNOSIS — J3089 Other allergic rhinitis: Secondary | ICD-10-CM | POA: Diagnosis present

## 2022-03-13 DIAGNOSIS — I10 Essential (primary) hypertension: Secondary | ICD-10-CM | POA: Diagnosis present

## 2022-03-13 DIAGNOSIS — K219 Gastro-esophageal reflux disease without esophagitis: Secondary | ICD-10-CM | POA: Diagnosis present

## 2022-03-13 DIAGNOSIS — Z9884 Bariatric surgery status: Principal | ICD-10-CM

## 2022-03-13 DIAGNOSIS — M25562 Pain in left knee: Secondary | ICD-10-CM | POA: Diagnosis present

## 2022-03-13 HISTORY — PX: GASTRIC ROUX-EN-Y: SHX5262

## 2022-03-13 HISTORY — PX: HIATAL HERNIA REPAIR: SHX195

## 2022-03-13 LAB — PREGNANCY, URINE: Preg Test, Ur: NEGATIVE

## 2022-03-13 LAB — CBC
HCT: 35.5 % — ABNORMAL LOW (ref 36.0–46.0)
Hemoglobin: 11.3 g/dL — ABNORMAL LOW (ref 12.0–15.0)
MCH: 29.5 pg (ref 26.0–34.0)
MCHC: 31.8 g/dL (ref 30.0–36.0)
MCV: 92.7 fL (ref 80.0–100.0)
Platelets: 236 10*3/uL (ref 150–400)
RBC: 3.83 MIL/uL — ABNORMAL LOW (ref 3.87–5.11)
RDW: 14.1 % (ref 11.5–15.5)
WBC: 8 10*3/uL (ref 4.0–10.5)
nRBC: 0 % (ref 0.0–0.2)

## 2022-03-13 LAB — TYPE AND SCREEN
ABO/RH(D): B POS
Antibody Screen: NEGATIVE

## 2022-03-13 LAB — ABO/RH: ABO/RH(D): B POS

## 2022-03-13 LAB — GLUCOSE, CAPILLARY: Glucose-Capillary: 107 mg/dL — ABNORMAL HIGH (ref 70–99)

## 2022-03-13 SURGERY — LAPAROSCOPIC ROUX-EN-Y GASTRIC BYPASS WITH UPPER ENDOSCOPY
Anesthesia: General | Site: Abdomen

## 2022-03-13 MED ORDER — KETAMINE HCL 10 MG/ML IJ SOLN
INTRAMUSCULAR | Status: DC | PRN
  Administered 2022-03-13: 20 mg via INTRAVENOUS
  Administered 2022-03-13: 30 mg via INTRAVENOUS

## 2022-03-13 MED ORDER — IRBESARTAN 300 MG PO TABS
300.0000 mg | ORAL_TABLET | Freq: Every day | ORAL | Status: DC
  Administered 2022-03-14: 300 mg via ORAL
  Filled 2022-03-13: qty 1

## 2022-03-13 MED ORDER — ROCURONIUM BROMIDE 10 MG/ML (PF) SYRINGE
PREFILLED_SYRINGE | INTRAVENOUS | Status: AC
  Filled 2022-03-13: qty 10

## 2022-03-13 MED ORDER — PROPOFOL 10 MG/ML IV BOLUS
INTRAVENOUS | Status: DC | PRN
  Administered 2022-03-13: 140 mg via INTRAVENOUS

## 2022-03-13 MED ORDER — MIDAZOLAM HCL 5 MG/5ML IJ SOLN
INTRAMUSCULAR | Status: DC | PRN
  Administered 2022-03-13: 2 mg via INTRAVENOUS

## 2022-03-13 MED ORDER — LIDOCAINE HCL (PF) 2 % IJ SOLN
INTRAMUSCULAR | Status: DC | PRN
  Administered 2022-03-13: 1.5 mg/kg/h via INTRADERMAL

## 2022-03-13 MED ORDER — SODIUM CHLORIDE (PF) 0.9 % IJ SOLN
INTRAMUSCULAR | Status: DC | PRN
  Administered 2022-03-13: 50 mL

## 2022-03-13 MED ORDER — DEXAMETHASONE SODIUM PHOSPHATE 4 MG/ML IJ SOLN: 4.0000 mg | INTRAMUSCULAR | Status: DC

## 2022-03-13 MED ORDER — KCL IN DEXTROSE-NACL 20-5-0.45 MEQ/L-%-% IV SOLN
INTRAVENOUS | Status: DC
  Filled 2022-03-13 (×3): qty 1000

## 2022-03-13 MED ORDER — PANTOPRAZOLE SODIUM 40 MG IV SOLR
40.0000 mg | Freq: Every day | INTRAVENOUS | Status: DC
  Administered 2022-03-13: 40 mg via INTRAVENOUS
  Filled 2022-03-13: qty 10

## 2022-03-13 MED ORDER — ACETAMINOPHEN 160 MG/5ML PO SOLN: 1000.0000 mg | Freq: Three times a day (TID) | ORAL | Status: DC

## 2022-03-13 MED ORDER — CHLORHEXIDINE GLUCONATE 4 % EX LIQD: Freq: Once | CUTANEOUS | Status: DC

## 2022-03-13 MED ORDER — APREPITANT 40 MG PO CAPS
40.0000 mg | ORAL_CAPSULE | ORAL | Status: AC
  Administered 2022-03-13: 40 mg via ORAL
  Filled 2022-03-13: qty 1

## 2022-03-13 MED ORDER — PROCHLORPERAZINE EDISYLATE 10 MG/2ML IJ SOLN
10.0000 mg | Freq: Four times a day (QID) | INTRAMUSCULAR | Status: DC | PRN
Start: 2022-03-13 — End: 2022-03-14

## 2022-03-13 MED ORDER — EPHEDRINE 5 MG/ML INJ
INTRAVENOUS | Status: AC
Start: 2022-03-13 — End: ?
  Filled 2022-03-13: qty 5

## 2022-03-13 MED ORDER — HYDRALAZINE HCL 20 MG/ML IJ SOLN: 10.0000 mg | INTRAMUSCULAR | Status: DC | PRN

## 2022-03-13 MED ORDER — MIDAZOLAM HCL 2 MG/2ML IJ SOLN
INTRAMUSCULAR | Status: AC
  Filled 2022-03-13: qty 2

## 2022-03-13 MED ORDER — ORAL CARE MOUTH RINSE: 15.0000 mL | Freq: Once | OROMUCOSAL | Status: AC

## 2022-03-13 MED ORDER — HEPARIN SODIUM (PORCINE) 5000 UNIT/ML IJ SOLN
5000.0000 [IU] | INTRAMUSCULAR | Status: AC
  Administered 2022-03-13: 5000 [IU] via SUBCUTANEOUS
  Filled 2022-03-13: qty 1

## 2022-03-13 MED ORDER — KETAMINE HCL 50 MG/5ML IJ SOSY
PREFILLED_SYRINGE | INTRAMUSCULAR | Status: AC
  Filled 2022-03-13: qty 5

## 2022-03-13 MED ORDER — FENTANYL CITRATE (PF) 100 MCG/2ML IJ SOLN
INTRAMUSCULAR | Status: DC | PRN
Start: 2022-03-13 — End: 2022-03-13
  Administered 2022-03-13: 100 ug via INTRAVENOUS
  Administered 2022-03-13: 50 ug via INTRAVENOUS

## 2022-03-13 MED ORDER — FENTANYL CITRATE (PF) 100 MCG/2ML IJ SOLN
INTRAMUSCULAR | Status: AC
  Filled 2022-03-13: qty 2

## 2022-03-13 MED ORDER — LACTATED RINGERS IR SOLN
Status: DC | PRN
  Administered 2022-03-13: 1000 mL

## 2022-03-13 MED ORDER — ONDANSETRON HCL 4 MG/2ML IJ SOLN
INTRAMUSCULAR | Status: AC
  Filled 2022-03-13: qty 2

## 2022-03-13 MED ORDER — FENTANYL CITRATE PF 50 MCG/ML IJ SOSY: 25.0000 ug | PREFILLED_SYRINGE | INTRAMUSCULAR | Status: DC | PRN

## 2022-03-13 MED ORDER — OXYCODONE HCL 5 MG/5ML PO SOLN
5.0000 mg | Freq: Four times a day (QID) | ORAL | Status: DC | PRN
  Administered 2022-03-13: 5 mg via ORAL
  Filled 2022-03-13: qty 5

## 2022-03-13 MED ORDER — LIDOCAINE HCL (PF) 2 % IJ SOLN
INTRAMUSCULAR | Status: AC
  Filled 2022-03-13: qty 5

## 2022-03-13 MED ORDER — LIDOCAINE HCL (CARDIAC) PF 100 MG/5ML IV SOSY
PREFILLED_SYRINGE | INTRAVENOUS | Status: DC | PRN
  Administered 2022-03-13: 100 mg via INTRAVENOUS

## 2022-03-13 MED ORDER — ACETAMINOPHEN 500 MG PO TABS
1000.0000 mg | ORAL_TABLET | Freq: Three times a day (TID) | ORAL | Status: DC
  Administered 2022-03-13 – 2022-03-14 (×3): 1000 mg via ORAL
  Filled 2022-03-13 (×3): qty 2

## 2022-03-13 MED ORDER — FIBRIN SEALANT 2 ML SINGLE DOSE KIT
2.0000 mL | PACK | Freq: Once | CUTANEOUS | Status: AC
  Administered 2022-03-13: 2 mL via TOPICAL
  Filled 2022-03-13: qty 2

## 2022-03-13 MED ORDER — ONDANSETRON HCL 4 MG/2ML IJ SOLN: 4.0000 mg | Freq: Four times a day (QID) | INTRAMUSCULAR | Status: DC | PRN

## 2022-03-13 MED ORDER — DIPHENHYDRAMINE HCL 50 MG/ML IJ SOLN: 12.5000 mg | Freq: Three times a day (TID) | INTRAMUSCULAR | Status: DC | PRN

## 2022-03-13 MED ORDER — SUGAMMADEX SODIUM 500 MG/5ML IV SOLN
INTRAVENOUS | Status: AC
  Filled 2022-03-13: qty 5

## 2022-03-13 MED ORDER — CHLORHEXIDINE GLUCONATE 0.12 % MT SOLN
15.0000 mL | Freq: Once | OROMUCOSAL | Status: AC
  Administered 2022-03-13: 15 mL via OROMUCOSAL

## 2022-03-13 MED ORDER — ACETAMINOPHEN 500 MG PO TABS
1000.0000 mg | ORAL_TABLET | ORAL | Status: AC
  Administered 2022-03-13: 1000 mg via ORAL
  Filled 2022-03-13: qty 2

## 2022-03-13 MED ORDER — DEXAMETHASONE SODIUM PHOSPHATE 10 MG/ML IJ SOLN
INTRAMUSCULAR | Status: DC | PRN
  Administered 2022-03-13: 8 mg via INTRAVENOUS

## 2022-03-13 MED ORDER — FLUTICASONE PROPIONATE 50 MCG/ACT NA SUSP
1.0000 | Freq: Every day | NASAL | Status: DC
  Filled 2022-03-13: qty 16

## 2022-03-13 MED ORDER — SUGAMMADEX SODIUM 500 MG/5ML IV SOLN
INTRAVENOUS | Status: DC | PRN
  Administered 2022-03-13: 250 mg via INTRAVENOUS

## 2022-03-13 MED ORDER — ENOXAPARIN SODIUM 30 MG/0.3ML IJ SOSY
30.0000 mg | PREFILLED_SYRINGE | Freq: Two times a day (BID) | INTRAMUSCULAR | Status: DC
  Administered 2022-03-13 – 2022-03-14 (×2): 30 mg via SUBCUTANEOUS
  Filled 2022-03-13 (×2): qty 0.3

## 2022-03-13 MED ORDER — LIP MEDEX EX OINT
TOPICAL_OINTMENT | CUTANEOUS | Status: DC | PRN
Start: 2022-03-13 — End: 2022-03-14
  Filled 2022-03-13: qty 7

## 2022-03-13 MED ORDER — SIMETHICONE 80 MG PO CHEW
80.0000 mg | CHEWABLE_TABLET | Freq: Four times a day (QID) | ORAL | Status: DC | PRN
  Administered 2022-03-13 – 2022-03-14 (×2): 80 mg via ORAL
  Filled 2022-03-13 (×2): qty 1

## 2022-03-13 MED ORDER — EPHEDRINE SULFATE-NACL 50-0.9 MG/10ML-% IV SOSY
PREFILLED_SYRINGE | INTRAVENOUS | Status: DC | PRN
  Administered 2022-03-13: 10 mg via INTRAVENOUS

## 2022-03-13 MED ORDER — ENSURE MAX PROTEIN PO LIQD
2.0000 [oz_av] | ORAL | Status: DC
  Administered 2022-03-14 (×4): 2 [oz_av] via ORAL

## 2022-03-13 MED ORDER — SODIUM CHLORIDE 0.9 % IV SOLN
2.0000 g | INTRAVENOUS | Status: AC
  Administered 2022-03-13: 2 g via INTRAVENOUS
  Filled 2022-03-13: qty 2

## 2022-03-13 MED ORDER — DEXAMETHASONE SODIUM PHOSPHATE 10 MG/ML IJ SOLN
INTRAMUSCULAR | Status: AC
Start: 2022-03-13 — End: ?
  Filled 2022-03-13: qty 1

## 2022-03-13 MED ORDER — LIDOCAINE HCL 2 % IJ SOLN
INTRAMUSCULAR | Status: AC
  Filled 2022-03-13: qty 20

## 2022-03-13 MED ORDER — SCOPOLAMINE 1 MG/3DAYS TD PT72
1.0000 | MEDICATED_PATCH | TRANSDERMAL | Status: DC
  Administered 2022-03-13: 1.5 mg via TRANSDERMAL
  Filled 2022-03-13: qty 1

## 2022-03-13 MED ORDER — STERILE WATER FOR IRRIGATION IR SOLN
Status: DC | PRN
  Administered 2022-03-13: 1000 mL

## 2022-03-13 MED ORDER — LACTATED RINGERS IV SOLN: INTRAVENOUS | Status: DC

## 2022-03-13 MED ORDER — BUPIVACAINE LIPOSOME 1.3 % IJ SUSP
20.0000 mL | Freq: Once | INTRAMUSCULAR | Status: AC
  Administered 2022-03-13: 20 mL

## 2022-03-13 MED ORDER — SODIUM CHLORIDE (PF) 0.9 % IJ SOLN
INTRAMUSCULAR | Status: AC
  Filled 2022-03-13: qty 50

## 2022-03-13 MED ORDER — ONDANSETRON HCL 4 MG/2ML IJ SOLN
INTRAMUSCULAR | Status: DC | PRN
  Administered 2022-03-13: 4 mg via INTRAVENOUS

## 2022-03-13 MED ORDER — ROCURONIUM BROMIDE 10 MG/ML (PF) SYRINGE
PREFILLED_SYRINGE | INTRAVENOUS | Status: DC | PRN
  Administered 2022-03-13: 30 mg via INTRAVENOUS
  Administered 2022-03-13: 100 mg via INTRAVENOUS
  Administered 2022-03-13: 20 mg via INTRAVENOUS

## 2022-03-13 MED ORDER — "VISTASEAL 4 ML SINGLE DOSE KIT "
4.0000 mL | PACK | Freq: Once | CUTANEOUS | Status: AC
  Administered 2022-03-13: 4 mL via TOPICAL
  Filled 2022-03-13: qty 4

## 2022-03-13 MED ORDER — BUPIVACAINE LIPOSOME 1.3 % IJ SUSP
INTRAMUSCULAR | Status: AC
Start: 2022-03-13 — End: ?
  Filled 2022-03-13: qty 20

## 2022-03-13 MED ORDER — MORPHINE SULFATE (PF) 2 MG/ML IV SOLN: 1.0000 mg | INTRAVENOUS | Status: DC | PRN

## 2022-03-13 SURGICAL SUPPLY — 109 items
APPLICATOR COTTON TIP 6 STRL (MISCELLANEOUS) ×1 IMPLANT
APPLICATOR COTTON TIP 6IN STRL (MISCELLANEOUS) ×2
APPLICATOR VISTASEAL 35 (MISCELLANEOUS) ×4 IMPLANT
APPLIER CLIP ROT 13.4 12 LRG (CLIP)
BAG COUNTER SPONGE SURGICOUNT (BAG) IMPLANT
BLADE EXTENDED COATED 6.5IN (ELECTRODE) IMPLANT
BLADE HEX COATED 2.75 (ELECTRODE) ×2 IMPLANT
BLADE SURG SZ11 CARB STEEL (BLADE) ×2 IMPLANT
CABLE HIGH FREQUENCY MONO STRZ (ELECTRODE) IMPLANT
CHLORAPREP W/TINT 26 (MISCELLANEOUS) ×3 IMPLANT
CLIP APPLIE ROT 13.4 12 LRG (CLIP) IMPLANT
CLIP SUT LAPRA TY ABSORB (SUTURE) ×4 IMPLANT
COVER MAYO STAND STRL (DRAPES) IMPLANT
CUTTER FLEX LINEAR 45M (STAPLE) IMPLANT
DEVICE SUT QUICK LOAD TK 5 (SUTURE) ×1 IMPLANT
DEVICE SUT TI-KNOT TK 5X26 (SUTURE) ×1 IMPLANT
DEVICE SUTURE ENDOST 10MM (ENDOMECHANICALS) ×2 IMPLANT
DRAIN PENROSE 0.25X18 (DRAIN) ×2 IMPLANT
DRAIN PENROSE 0.5X18 (DRAIN) ×1 IMPLANT
DRAPE LAPAROSCOPIC ABDOMINAL (DRAPES) ×2 IMPLANT
DRAPE UTILITY XL STRL (DRAPES) ×2 IMPLANT
DRAPE WARM FLUID 44X44 (DRAPES) IMPLANT
DRSG TEGADERM 2-3/8X2-3/4 SM (GAUZE/BANDAGES/DRESSINGS) ×12 IMPLANT
ELECT REM PT RETURN 15FT ADLT (MISCELLANEOUS) ×2 IMPLANT
GAUZE 4X4 16PLY ~~LOC~~+RFID DBL (SPONGE) ×2 IMPLANT
GAUZE SPONGE 2X2 8PLY STRL LF (GAUZE/BANDAGES/DRESSINGS) ×1 IMPLANT
GAUZE SPONGE 4X4 12PLY STRL (GAUZE/BANDAGES/DRESSINGS) ×2 IMPLANT
GLOVE BIO SURGEON STRL SZ7.5 (GLOVE) IMPLANT
GLOVE BIOGEL PI IND STRL 7.0 (GLOVE) ×1 IMPLANT
GLOVE BIOGEL PI IND STRL 8 (GLOVE) ×2 IMPLANT
GLOVE BIOGEL PI INDICATOR 7.0 (GLOVE) ×1
GLOVE BIOGEL PI INDICATOR 8 (GLOVE) ×2
GLOVE BIOGEL PI ORTHO PRO 7.5 (GLOVE) ×2
GLOVE ECLIPSE 8.0 STRL XLNG CF (GLOVE) ×2 IMPLANT
GLOVE PI ORTHO PRO STRL 7.5 (GLOVE) ×2 IMPLANT
GLOVE SURG LX 7.5 STRW (GLOVE) ×1
GLOVE SURG LX STRL 7.5 STRW (GLOVE) ×1 IMPLANT
GOWN STRL REUS W/ TWL XL LVL3 (GOWN DISPOSABLE) ×4 IMPLANT
GOWN STRL REUS W/TWL XL LVL3 (GOWN DISPOSABLE) ×4
HANDLE SUCTION POOLE (INSTRUMENTS) IMPLANT
IRRIG SUCT STRYKERFLOW 2 WTIP (MISCELLANEOUS) ×2
IRRIGATION SUCT STRKRFLW 2 WTP (MISCELLANEOUS) ×1 IMPLANT
KIT BASIN OR (CUSTOM PROCEDURE TRAY) ×2 IMPLANT
KIT GASTRIC LAVAGE 34FR ADT (SET/KITS/TRAYS/PACK) ×2 IMPLANT
KIT TURNOVER KIT A (KITS) ×1 IMPLANT
MARKER SKIN DUAL TIP RULER LAB (MISCELLANEOUS) ×2 IMPLANT
MAT PREVALON FULL STRYKER (MISCELLANEOUS) ×2 IMPLANT
NDL SPNL 22GX3.5 QUINCKE BK (NEEDLE) ×1 IMPLANT
NEEDLE SPNL 22GX3.5 QUINCKE BK (NEEDLE) ×2 IMPLANT
NS IRRIG 1000ML POUR BTL (IV SOLUTION) ×2 IMPLANT
PACK CARDIOVASCULAR III (CUSTOM PROCEDURE TRAY) ×2 IMPLANT
PACK GENERAL/GYN (CUSTOM PROCEDURE TRAY) ×1 IMPLANT
PENCIL SMOKE EVACUATOR (MISCELLANEOUS) IMPLANT
RELOAD 45 VASCULAR/THIN (ENDOMECHANICALS) IMPLANT
RELOAD ENDO STITCH 2.0 (ENDOMECHANICALS) ×9
RELOAD STAPLE 45 2.5 WHT GRN (ENDOMECHANICALS) IMPLANT
RELOAD STAPLE 45 3.5 BLU ETS (ENDOMECHANICALS) IMPLANT
RELOAD STAPLE 60 2.6 WHT THN (STAPLE) ×2 IMPLANT
RELOAD STAPLE 60 3.6 BLU REG (STAPLE) ×2 IMPLANT
RELOAD STAPLE 60 3.8 GOLD REG (STAPLE) ×1 IMPLANT
RELOAD STAPLE TA45 3.5 REG BLU (ENDOMECHANICALS) IMPLANT
RELOAD STAPLER BLUE 60MM (STAPLE) ×4 IMPLANT
RELOAD STAPLER GOLD 60MM (STAPLE) ×1 IMPLANT
RELOAD STAPLER WHITE 60MM (STAPLE) ×2 IMPLANT
RELOAD SUT SNGL STCH ABSRB 2-0 (ENDOMECHANICALS) ×5 IMPLANT
RELOAD SUT SNGL STCH BLK 2-0 (ENDOMECHANICALS) ×4 IMPLANT
SCISSORS LAP 5X45 EPIX DISP (ENDOMECHANICALS) ×2 IMPLANT
SET TUBE SMOKE EVAC HIGH FLOW (TUBING) ×2 IMPLANT
SHEARS HARMONIC ACE PLUS 45CM (MISCELLANEOUS) ×2 IMPLANT
SLEEVE ADV FIXATION 12X100MM (TROCAR) ×3 IMPLANT
SLEEVE Z-THREAD 5X100MM (TROCAR) ×1 IMPLANT
SOL ANTI FOG 6CC (MISCELLANEOUS) ×1 IMPLANT
SOLUTION ANTI FOG 6CC (MISCELLANEOUS) ×1
SPONGE GAUZE 2X2 STER 10/PKG (GAUZE/BANDAGES/DRESSINGS) ×1
STAPLE LINE REINFORCEMENT LAP (STAPLE) ×3 IMPLANT
STAPLER ECHELON BIOABSB 60 FLE (MISCELLANEOUS) IMPLANT
STAPLER ECHELON LONG 60 440 (INSTRUMENTS) ×2 IMPLANT
STAPLER RELOAD BLUE 60MM (STAPLE) ×8
STAPLER RELOAD GOLD 60MM (STAPLE) ×2
STAPLER RELOAD WHITE 60MM (STAPLE) ×4
STAPLER VISISTAT 35W (STAPLE) ×1 IMPLANT
STRIP CLOSURE SKIN 1/2X4 (GAUZE/BANDAGES/DRESSINGS) ×2 IMPLANT
SUCTION POOLE HANDLE (INSTRUMENTS)
SURGILUBE 2OZ TUBE FLIPTOP (MISCELLANEOUS) ×1 IMPLANT
SUT MNCRL AB 4-0 PS2 18 (SUTURE) ×3 IMPLANT
SUT RELOAD ENDO STITCH 2 48X1 (ENDOMECHANICALS) ×5
SUT RELOAD ENDO STITCH 2.0 (ENDOMECHANICALS) ×4
SUT SILK 2 0 (SUTURE)
SUT SILK 2 0 SH CR/8 (SUTURE) IMPLANT
SUT SILK 2-0 18XBRD TIE 12 (SUTURE) IMPLANT
SUT SILK 3 0 (SUTURE)
SUT SILK 3 0 SH CR/8 (SUTURE) IMPLANT
SUT SILK 3-0 18XBRD TIE 12 (SUTURE) IMPLANT
SUT SURGIDAC NAB ES-9 0 48 120 (SUTURE) ×1 IMPLANT
SUT VIC AB 2-0 SH 27 (SUTURE) ×1
SUT VIC AB 2-0 SH 27X BRD (SUTURE) ×1 IMPLANT
SUT VICRYL 2 0 18  UND BR (SUTURE)
SUT VICRYL 2 0 18 UND BR (SUTURE) IMPLANT
SUTURE RELOAD END STTCH 2 48X1 (ENDOMECHANICALS) ×5 IMPLANT
SUTURE RELOAD ENDO STITCH 2.0 (ENDOMECHANICALS) ×4 IMPLANT
SYR 20ML LL LF (SYRINGE) ×4 IMPLANT
TOWEL OR 17X26 10 PK STRL BLUE (TOWEL DISPOSABLE) ×4 IMPLANT
TOWEL OR NON WOVEN STRL DISP B (DISPOSABLE) ×2 IMPLANT
TRAY FOLEY MTR SLVR 16FR STAT (SET/KITS/TRAYS/PACK) IMPLANT
TROCAR ADV FIXATION 12X100MM (TROCAR) ×2 IMPLANT
TROCAR UNIVERSAL OPT 12M 100M (ENDOMECHANICALS) ×3 IMPLANT
TROCAR Z-THREAD OPTICAL 5X100M (TROCAR) ×2 IMPLANT
TUBING CONNECTING 10 (TUBING) ×3 IMPLANT
YANKAUER SUCT BULB TIP NO VENT (SUCTIONS) IMPLANT

## 2022-03-13 NOTE — Transfer of Care (Signed)
Immediate Anesthesia Transfer of Care Note  Patient: [name]  Procedure(s) Performed: LAPAROSCOPIC ROUX-EN-Y GASTRIC BYPASS WITH UPPER ENDOSCOPY (Abdomen) LAPAROSCOPIC HERNIA REPAIR HIATAL (Abdomen)  Patient Location: PACU  Anesthesia Type:General  Level of Consciousness: sedated  Airway & Oxygen Therapy: Patient Spontanous Breathing and Patient connected to face mask oxygen  Post-op Assessment: Report given to RN and Post -op Vital signs reviewed and stable  Post vital signs: Reviewed and stable  Last Vitals:  Vitals Value Taken Time  BP 145/83 03/13/22 1337  Temp    Pulse 72 03/13/22 1338  Resp 0 03/13/22 1338  SpO2 100 % 03/13/22 1338  Vitals shown include unvalidated device data.  Last Pain:  Vitals:   03/13/22 0854  TempSrc:   PainSc: 0-No pain         Complications: No notable events documented.

## 2022-03-13 NOTE — Discharge Instructions (Signed)

## 2022-03-13 NOTE — H&P (Signed)
PROVIDER: Shontel Santee Leanne Chang, MD  MRN: [id number] DOB: 10-20-1975 DATE OF ENCOUNTER: 02/15/2022 Subjective   Chief Complaint: return weight loss   History of Present Illness: Rachel Swanson is a 46 y.o. female who is seen today for follow-up regarding her severe obesity and related comorbidities.  Her comorbidities include hypertension, allergies, GERD, prediabetes, chronic left knee pain; elevated LDL  She states she is doing well. No changes since I initially saw her in the clinic a few months ago. No trips to the emergency room Hospital. She denies any chest pain, chest pressure, shortness of breath, dyspnea exertion. No diarrhea or constipation. Her heartburn is controlled with her daily heartburn medication. No blood thinners. Denies tobacco use. She had an annual physical with her PCP a few weeks ago. Review of Systems: A complete review of systems was obtained from the patient. I have reviewed this information and discussed as appropriate with the patient. See HPI as well for other ROS.  ROS   Medical History: Past Medical History:  Diagnosis Date   GERD (gastroesophageal reflux disease) 2016   Hypertension 2000   Prediabetes   Seasonal allergies   Patient Active Problem List  Diagnosis   Essential hypertension   Non-seasonal allergic rhinitis   Adult BMI 35.0-35.9 kg/sq m   Gastroesophageal reflux disease without esophagitis   No past surgical history on file.   No Known Allergies  Current Outpatient Medications on File Prior to Visit  Medication Sig Dispense Refill   diclofenac (VOLTAREN) 1 % topical gel Apply 4 g topically 4 (four) times daily as needed. 100 g 3   fluticasone (FLONASE) 50 mcg/actuation nasal spray Place 2 sprays into both nostrils once daily.   hydroCHLOROthiazide (HYDRODIURIL) 12.5 MG tablet Take by mouth   hydroCHLOROthiazide (HYDRODIURIL) 25 MG tablet TAKE 1 TABLET (25 MG TOTAL) BY MOUTH ONCE DAILY. PLEASE CONTACT OFFICE TO [facility] APPT 90  tablet 1   levocetirizine (XYZAL) 5 MG tablet Take by mouth   montelukast (SINGULAIR) 10 mg tablet TAKE 1 TABLET (10 MG TOTAL) BY MOUTH ONCE DAILY. PLEASE CONTACT OFFICE TO MAKE APPT 90 tablet 1   montelukast (SINGULAIR) 10 mg tablet Take by mouth   olmesartan (BENICAR) 40 MG tablet Take by mouth   omeprazole (PRILOSEC) 20 MG DR capsule Take by mouth   verapamil (CALAN-SR) 240 MG SR tablet TAKE 1 TABLET (240 MG TOTAL) BY MOUTH ONCE DAILY. (Patient not taking: Reported on 05/27/2021) 90 tablet 1   No current facility-administered medications on file prior to visit.   Family History  Problem Relation Age of Onset   Obesity Mother   High blood pressure (Hypertension) Mother   Myocardial Infarction (Heart attack) Father    Social History   Tobacco Use  Smoking Status Never  Smokeless Tobacco Never    Social History   Socioeconomic History   Marital status: Single  Tobacco Use   Smoking status: Never   Smokeless tobacco: Never  Vaping Use   Vaping Use: Never used  Substance and Sexual Activity   Alcohol use: Yes  Comment: occasional   Drug use: Defer   Sexual activity: Defer   Objective:   Vitals:  02/15/22 0848  BP: 116/72  Pulse: 90  Temp: 36.3 C (97.3 F)  SpO2: 99%  Weight: (!) 124.3 kg (274 lb)  Height: 165.1 cm ('5\' 5"'$ )   Body mass index is 45.6 kg/m.  Gen: alert, NAD, non-toxic appearing; severe obesity Pupils: equal, no scleral icterus Pulm: Lungs clear to  auscultation, symmetric chest rise CV: regular rate and rhythm Abd: soft, nontender, nondistended. . No cellulitis. No incisional hernia Ext: no edema,  Skin: no rash, no jaundice  Labs, Imaging and Diagnostic Testing: Comprehensive metabolic panel, CBC, on May 5 were normal. Hemoglobin A1c was 6.2. Lipid panel was unremarkable except for an LDL level of 116  PCP letter from May 5  Chest x-ray March 10 unremarkable  Upper GI from March 10 showed normal esophageal motility, moderate-sized sliding  hiatal hernia with associated reflux Assessment and Plan:  Diagnoses and all orders for this visit:  Severe obesity (CMS-HCC)  Benign hypertension  Gastroesophageal reflux disease without esophagitis  Prediabetes  Hiatal hernia    We reviewed her preoperative work-up. She has received nutritional and psychological clearance. We reviewed her labs. We reviewed the finding of a moderate size hiatal hernia. We discussed the anatomy of a hiatal hernia. We discussed that we would need to repair this during surgery. I discussed what would be involved with hiatal hernia repair. We discussed the potential need for ongoing heartburn medications despite surgical repair of hiatal hernia and Roux-en-Y gastric bypass. We rediscussed the typical aftercare. She has attended her preoperative education class. We discussed the typical diet progression after surgery. We discussed the typical quirks that we can see after surgery. We discussed typical restrictions in the immediate week or 2 after surgery. She reviewed and signed the surgical consent form and all her questions were asked and answered. We discussed the importance of the preoperative meal plan.  This patient encounter took 30 minutes today to perform the following: take history, perform exam, review outside records, interpret imaging, counsel the patient on their diagnosis and document encounter, findings & plan in the EHR  No follow-ups on file.  Leighton Ruff. Redmond Pulling MD FACS General, Minimally Invasive, & Bariatric Surgery Electronically signed by Rudean Curt, MD at 02/15/2022 9:17 AM EDT

## 2022-03-13 NOTE — Op Note (Signed)
Rachel Swanson [id number] Feb 24, 1976. 03/13/2022  Preoperative diagnosis:  Severe obesity (CMS-HCC)  Benign hypertension  Gastroesophageal reflux disease without esophagitis  Prediabetes  Hiatal hernia  Postoperative  diagnosis:  1. same  Surgical procedure: Laparoscopic Roux-en-Y gastric bypass (ante-colic, ante-gastric) with hiatal hernia; upper endoscopy  Surgeon: Gayland Curry, M.D. FACS  Asst.: Johnathan Hausen MD FACS  Anesthesia: General plus exparel/marcaine mix  Complications: None   EBL: Minimal   Drains: None   Disposition: PACU in good condition   Indications for procedure: 46 y.o. yo female with morbid obesity who has been unsuccessful at sustained weight loss. The patient's comorbidities are listed above. We discussed the risk and benefits of surgery including but not limited to anesthesia risk, bleeding, infection, blood clot formation, anastomotic leak, anastomotic stricture, ulcer formation, death, respiratory complications, intestinal blockage, internal hernia, gallstone formation, vitamin and nutritional deficiencies, injury to surrounding structures, failure to lose weight and mood changes.   Description of procedure: Patient is brought to the operating room and general anesthesia induced. The patient had received preoperative broad-spectrum IV antibiotics and subcutaneous heparin. The abdomen was widely sterilely prepped with Chloraprep and draped. Patient timeout was performed and correct patient and procedure confirmed. Access was obtained with a 5 mm Optiview trocar in the left upper quadrant and pneumoperitoneum established without difficulty. Under direct vision 12 mm trocars were placed laterally in the right upper quadrant, right upper quadrant midclavicular line, and to the left and above the umbilicus for the camera port. A 5 mm trocar was placed laterally in the left upper quadrant.  The optical entry trocar was exchanged for a 12 mm trocar.   Exparel/marcaine mix was infiltrated in bilateral lateral abdominal walls as a TAP block for postoperative pain relief. The omentum was brought into the upper abdomen and the transverse mesocolon elevated and the ligament of Treitz clearly identified. A 50 cm biliopancreatic limb was then carefully measured from the ligament of Treitz. The small intestine was divided at this point with a single firing of the white load linear stapler. A Penrose drain was sutured to the end of the Roux-en-Y limb for later identification. A 100 cm Roux-en-Y limb was then carefully measured. At this point a side-to-side anastomosis was created between the Roux limb and the end of the biliopancreatic limb. This was accomplished with a single firing of the 60 mm white load linear stapler. The common enterotomy was closed with a running 2-0 Vicryl begun at either end of the enterotomy and tied centrally. The mesenteric defect was then closed with running 2-0 silk. The omentum was then divided with the harmonic scalpel up towards the transverse colon to allow mobility of the Roux limb toward the gastric pouch. The patient was then placed in steep reversed Trendelenburg. Through a 5 mm subxiphoid site the [facility] retractor was placed and the left lobe of the liver elevated with excellent exposure of the upper stomach and hiatus. The angle of Hiss was then mobilized with the harmonic scalpel.   The patient had an obvious sliding hiatal hernia.  My assistant helped retract the gastropathic ligament and I incised it with harmonic scalpel.  The right crus of the diaphragm was identified.  I gently incised the peritoneum overlying it with harmonic scalpel.  I then gently bluntly mobilized some of the perigastric and distal esophageal fat with the procedure grasper.  The confluence of the left and right crura were identified.  I continued mobilization anteriorly across the anterior diaphragm with blunt dissection along  with harmonic scalpel.   I ended up placing a Penrose drain into the upper abdomen and passing a retrogastric, assistant lifted up on it and I continued circumferential mobilization of the distal esophagus and upper stomach out of the mediastinum.  I had achieved complete reduction of the sliding hiatal hernia.  I then reapproximated the left and right crura with 3 interrupted 0 Ethibond Surgidac sutures each secured with a titanium tie knot.  A 5 cm gastric pouch was then carefully measured along the lesser curve of the stomach. Dissection was carried along the lesser curve at this point with the Harmonic scalpel working carefully back toward the lesser sac at right angles to the lesser curve. The free lesser sac was then entered. After being sure all tubes were removed from the stomach an initial firing of the gold load 60 mm linear stapler was fired at right angles across the lesser curve for about 4 cm. The gastric pouch was further mobilized posteriorly and then the pouch was completed with 4 further firings of the 60 mm blue load linear stapler up through the previously dissected angle of His (last 3 had staple line reinforcement). It was ensured that the pouch was completely mobilized away from the gastric remnant. This created a nice tubular 4-5 cm gastric pouch. The Roux limb was then brought up in an antecolic fashion with the candycane facing to the patient's left without undue tension. The gastrojejunostomy was created with an initial posterior row of 2-0 Vicryl between the Roux limb and the staple line of the gastric pouch. Enterotomies were then made in the gastric pouch and the Roux limb with the harmonic scalpel and at approximately 2-2-1/2 cm anastomosis was created with a single firing of the 63m blue load linear stapler. The staple line was inspected and was intact without bleeding. The common enterotomy was then closed with running 2-0 Vicryl begun at either end and tied centrally. The Ewall tube was then easily  passed through the anastomosis and an outer anterior layer of running 2-0 Vicryl was placed. The Ewald tube was removed. With the outlet of the gastrojejunostomy clamped and under saline irrigation the assistant performed upper endoscopy and with the gastric pouch tensely distended with air-there was no evidence of leak on this test. The pouch was desufflated. The PTerance Hartdefect was closed with running 2-0 silk. The abdomen was inspected for any evidence of bleeding or bowel injury and everything looked fine. The Nathanson retractor was removed under direct vision after coating the anastomosis with Vistaseal tissue sealant. All CO2 was evacuated and trochars removed. Skin incisions were closed with 4-0 monocryl in a subcuticular fashion followed by steri-strips and bandages. Sponge needle and instrument counts were correct. The patient was taken to the PACU in good condition.    ELeighton Ruff WRedmond Pulling MD, FACS General, Bariatric, & Minimally Invasive Surgery [facility] [place]

## 2022-03-13 NOTE — Op Note (Signed)
Rachel Swanson [id number] [birth year] 03/13/2022  Preoperative diagnosis: hiatal hernia and roux en Y gastric bypass in progress  Postoperative diagnosis: Same   Procedure: Upper endoscopy   Surgeon: Catalina Antigua B. Hassell Done  M.D., FACS   Anesthesia: Gen.   Indications for procedure: This patient was undergoing a hiatal hernia repair for a moderate sliding hiatal hernia and a gastric bypass for obesity.    Description of procedure: The endoscopy was placed in the mouth and into the oropharynx and under endoscopic vision it was advanced to the esophagogastric junction.  The pouch was insufflated and was ~ 5 cm in length.  The hiatal hernia had been repaired. .   No bleeding or leaks were detected.  The scope was withdrawn without difficulty.     Matt B. Hassell Done, MD, FACS General, Bariatric, & Minimally Invasive Surgery [facility], [place]

## 2022-03-13 NOTE — Progress Notes (Signed)

## 2022-03-13 NOTE — Anesthesia Postprocedure Evaluation (Signed)
Anesthesia Post Note  Patient: Rachel Swanson  Procedure(s) Performed: LAPAROSCOPIC ROUX-EN-Y GASTRIC BYPASS WITH UPPER ENDOSCOPY (Abdomen) LAPAROSCOPIC HERNIA REPAIR HIATAL (Abdomen)     Patient location during evaluation: PACU Anesthesia Type: General Level of consciousness: awake and alert Pain management: pain level controlled Vital Signs Assessment: post-procedure vital signs reviewed and stable Respiratory status: spontaneous breathing, nonlabored ventilation, respiratory function stable and patient connected to nasal cannula oxygen Cardiovascular status: blood pressure returned to baseline and stable Postop Assessment: no apparent nausea or vomiting Anesthetic complications: no   No notable events documented.  Last Vitals:  Vitals:   03/13/22 1430 03/13/22 1443  BP: 140/84 (!) 122/100  Pulse: 74 76  Resp: 18 18  Temp: 36.5 C   SpO2: 100% 97%    Last Pain:  Vitals:   03/13/22 1430  TempSrc:   PainSc: 0-No pain                 Belenda Cruise P Noely Kuhnle

## 2022-03-13 NOTE — Progress Notes (Signed)
PHARMACY CONSULT FOR:  Risk Assessment for Post-Discharge VTE Following Bariatric Surgery  Post-Discharge VTE Risk Assessment: This patient's probability of 30-day post-discharge VTE is increased due to the factors marked:  Sleeve gastrectomy   Liver disorder (transplant, cirrhosis, or nonalcoholic steatohepatitis)   Hx of VTE   Hemorrhage requiring transfusion   GI perforation, leak, or obstruction   ====================================================    Female    Age >/=60 years    BMI >/=50 kg/m2    CHF    Dyspnea at Rest    Paraplegia  X  Non-gastric-band surgery    Operation Time >/=3 hr    Return to OR     Length of Stay >/= 3 d   Hypercoagulable condition   Significant venous stasis      Predicted probability of 30-day post-discharge VTE: 0.16%  Other patient-specific factors to consider: no  Recommendation for Discharge: No pharmacologic prophylaxis post-discharge  Rachel Swanson is a 46 y.o. 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  No results for input(s): "WBC", "HGB", "HCT", "PLT", "APTT", "CREATININE", "LABCREA", "CREAT24HRUR", "MG", "PHOS", "ALBUMIN", "PROT", "AST", "ALT", "ALKPHOS", "BILITOT", "BILIDIR", "IBILI" in the last 72 hours. Estimated Creatinine Clearance: 99.9 mL/min (by C-G formula based on SCr of 0.92 mg/dL).    Past Medical History:  Diagnosis Date   GERD (gastroesophageal reflux disease)    Hypertension    Pre-diabetes    Prediabetes    Seasonal allergies      Medications Prior to Admission  Medication Sig Dispense Refill Last Dose   fluticasone (FLONASE) 50 MCG/ACT nasal spray PLACE 1 SPRAY INTO BOTH NOSTRILS 2 (TWO) TIMES DAILY. FOR ALLERGIES. (Patient taking differently: Place 1 spray into both nostrils  daily.) 48 mL 0 03/12/2022   hydrochlorothiazide (HYDRODIURIL) 12.5 MG tablet Take 1 tablet (12.5 mg total) by mouth daily. For blood pressure. 90 tablet 3 03/12/2022   levocetirizine (XYZAL) 5 MG tablet TAKE 1 TABLET (5 MG TOTAL) BY MOUTH EVERY EVENING. FOR COUGH/ALLERGIES 90 tablet 0 03/12/2022   montelukast (SINGULAIR) 10 MG tablet TAKE 1 TABLET (10 MG TOTAL) BY MOUTH AT BEDTIME. FOR ALLERGIES. (Patient taking differently: Take 10 mg by mouth daily.) 90 tablet 0 03/12/2022   olmesartan (BENICAR) 40 MG tablet TAKE 1 TABLET (40 MG TOTAL) BY MOUTH DAILY. FOR BLOOD PRESSURE. 90 tablet 2 03/12/2022   omeprazole (PRILOSEC) 20 MG capsule Take 1 capsule (20 mg total) by mouth daily. For heartburn. 90 capsule 3 03/13/2022 at 0700   diclofenac Sodium (VOLTAREN) 1 % GEL Apply 2 g topically 3 (three) times daily as needed (pain). 100 g 2 More than a month   tetrahydrozoline 0.05 % ophthalmic solution Place 1 drop into both eyes daily as needed (dry/irritated eyes).   More than a month    Eudelia Bunch, Pharm.D 03/13/2022 2:04 PM

## 2022-03-13 NOTE — Anesthesia Preprocedure Evaluation (Addendum)
Anesthesia Evaluation  Patient identified by MRN, date of birth, ID band Patient awake    Reviewed: Allergy & Precautions, NPO status , Patient's Chart, lab work & pertinent test results  Airway Mallampati: III  TM Distance: >3 FB Neck ROM: Full    Dental no notable dental hx. (+) Partial Upper   Pulmonary neg pulmonary ROS,    Pulmonary exam normal        Cardiovascular hypertension, Pt. on medications  Rhythm:Regular Rate:Normal     Neuro/Psych negative neurological ROS  negative psych ROS   GI/Hepatic Neg liver ROS, GERD  Medicated,  Endo/Other  Morbid obesity  Renal/GU negative Renal ROS  negative genitourinary   Musculoskeletal  (+) Arthritis , Osteoarthritis,    Abdominal (+) + obese,   Peds  Hematology negative hematology ROS (+)   Anesthesia Other Findings   Reproductive/Obstetrics                          Anesthesia Physical Anesthesia Plan  ASA: 3  Anesthesia Plan: General   Post-op Pain Management: Ketamine IV*, Tylenol PO (pre-op)* and Lidocaine infusion*   Induction: Intravenous  PONV Risk Score and Plan: 3 and Ondansetron, Dexamethasone, Midazolam and Treatment may vary due to age or medical condition  Airway Management Planned: Mask and Oral ETT  Additional Equipment: None  Intra-op Plan:   Post-operative Plan: Extubation in OR  Informed Consent: I have reviewed the patients History and Physical, chart, labs and discussed the procedure including the risks, benefits and alternatives for the proposed anesthesia with the patient or authorized representative who has indicated his/her understanding and acceptance.     Dental advisory given  Plan Discussed with:   Anesthesia Plan Comments: (Lab Results      Component                Value               Date                      WBC                      5.7                 03/09/2022                HGB                       12.9                03/09/2022                HCT                      39.7                03/09/2022                MCV                      89.8                03/09/2022                PLT  278                 03/09/2022           Lab Results      Component                Value               Date                      NA                       139                 03/09/2022                K                        3.4 (L)             03/09/2022                CO2                      26                  03/09/2022                GLUCOSE                  98                  03/09/2022                BUN                      12                  03/09/2022                CREATININE               0.92                03/09/2022                CALCIUM                  9.6                 03/09/2022                GFRNONAA                 >60                 03/09/2022          )        Anesthesia Quick Evaluation

## 2022-03-13 NOTE — Interval H&P Note (Signed)
History and Physical Interval Note:  03/13/2022 9:33 AM  Rachel Swanson  has presented today for surgery, with the diagnosis of MORBID OBESITY.  The various methods of treatment have been discussed with the patient and family. After consideration of risks, benefits and other options for treatment, the patient has consented to  Procedure(s): LAPAROSCOPIC ROUX-EN-Y GASTRIC BYPASS WITH UPPER ENDOSCOPY (N/A) HERNIA REPAIR HIATAL (N/A) as a surgical intervention.  The patient's history has been reviewed, patient examined, no change in status, stable for surgery.  I have reviewed the patient's chart and labs.  Questions were answered to the patient's satisfaction.     [name]

## 2022-03-13 NOTE — Anesthesia Procedure Notes (Signed)
Procedure Name: Intubation Date/Time: 03/13/2022 10:36 AM  Performed by: Lind Covert, CRNAPre-anesthesia Checklist: Patient identified, Emergency Drugs available, Suction available, Patient being monitored and Timeout performed Patient Re-evaluated:Patient Re-evaluated prior to induction Oxygen Delivery Method: Circle system utilized Preoxygenation: Pre-oxygenation with 100% oxygen Induction Type: IV induction Ventilation: Mask ventilation without difficulty Laryngoscope Size: Mac and 4 Grade View: Grade I Tube type: Oral Tube size: 7.0 mm Number of attempts: 1 Airway Equipment and Method: Stylet Placement Confirmation: ETT inserted through vocal cords under direct vision, positive ETCO2 and breath sounds checked- equal and bilateral Secured at: 22 cm Tube secured with: Tape Dental Injury: Teeth and Oropharynx as per pre-operative assessment

## 2022-03-14 ENCOUNTER — Other Ambulatory Visit (HOSPITAL_COMMUNITY): Payer: Self-pay

## 2022-03-14 ENCOUNTER — Encounter (HOSPITAL_COMMUNITY): Payer: Self-pay | Admitting: General Surgery

## 2022-03-14 LAB — COMPREHENSIVE METABOLIC PANEL
ALT: 27 U/L (ref 0–44)
AST: 30 U/L (ref 15–41)
Albumin: 3.4 g/dL — ABNORMAL LOW (ref 3.5–5.0)
Alkaline Phosphatase: 56 U/L (ref 38–126)
Anion gap: 7 (ref 5–15)
BUN: 9 mg/dL (ref 6–20)
CO2: 26 mmol/L (ref 22–32)
Calcium: 9.2 mg/dL (ref 8.9–10.3)
Chloride: 106 mmol/L (ref 98–111)
Creatinine, Ser: 0.97 mg/dL (ref 0.44–1.00)
GFR, Estimated: >60 mL/min (ref >60)
Glucose, Bld: 162 mg/dL — ABNORMAL HIGH (ref 70–99)
Potassium: 4.5 mmol/L (ref 3.5–5.1)
Sodium: 139 mmol/L (ref 135–145)
Total Bilirubin: 0.5 mg/dL (ref 0.3–1.2)
Total Protein: 7.3 g/dL (ref 6.5–8.1)

## 2022-03-14 LAB — CBC WITH DIFFERENTIAL/PLATELET
Abs Immature Granulocytes: 0.1 10*3/uL — ABNORMAL HIGH (ref 0.00–0.07)
Basophils Absolute: 0 10*3/uL (ref 0.0–0.1)
Basophils Relative: 0 %
Eosinophils Absolute: 0 10*3/uL (ref 0.0–0.5)
Eosinophils Relative: 0 %
HCT: 39 % (ref 36.0–46.0)
Hemoglobin: 12.6 g/dL (ref 12.0–15.0)
Immature Granulocytes: 1 %
Lymphocytes Relative: 10 %
Lymphs Abs: 1.4 10*3/uL (ref 0.7–4.0)
MCH: 29.4 pg (ref 26.0–34.0)
MCHC: 32.3 g/dL (ref 30.0–36.0)
MCV: 90.9 fL (ref 80.0–100.0)
Monocytes Absolute: 0.6 10*3/uL (ref 0.1–1.0)
Monocytes Relative: 4 %
Neutro Abs: 11.6 10*3/uL — ABNORMAL HIGH (ref 1.7–7.7)
Neutrophils Relative %: 85 %
Platelets: 273 10*3/uL (ref 150–400)
RBC: 4.29 MIL/uL (ref 3.87–5.11)
RDW: 14 % (ref 11.5–15.5)
WBC: 13.6 10*3/uL — ABNORMAL HIGH (ref 4.0–10.5)
nRBC: 0 % (ref 0.0–0.2)

## 2022-03-14 MED ORDER — ONDANSETRON 4 MG PO TBDP
4.0000 mg | ORAL_TABLET | Freq: Four times a day (QID) | ORAL | 0 refills | Status: DC | PRN
  Filled 2022-03-14: qty 20, 5d supply, fill #0

## 2022-03-14 MED ORDER — TRAMADOL HCL 50 MG PO TABS
50.0000 mg | ORAL_TABLET | Freq: Four times a day (QID) | ORAL | 0 refills | Status: DC | PRN
  Filled 2022-03-14: qty 10, 3d supply, fill #0

## 2022-03-14 MED ORDER — ACETAMINOPHEN 500 MG PO TABS: 1000.0000 mg | ORAL_TABLET | Freq: Three times a day (TID) | ORAL | 0 refills | Status: AC

## 2022-03-14 NOTE — Progress Notes (Signed)
Discharge instructions given to patient and all questions were answered.  

## 2022-03-14 NOTE — Progress Notes (Signed)
Patient alert and oriented, pain is controlled. Patient is tolerating fluids, advanced to protein shake today, patient is tolerating well. Reviewed Gastric Bypass discharge instructions with patient and patient is able to articulate understanding. Provided information on BELT program, Support Group and [facility] outpatient pharmacy. All questions answered, will continue to monitor.  24 hour fluid recall: 942m. Per dehydration protocol will call pt within one week to follow-up.

## 2022-03-14 NOTE — Discharge Summary (Signed)
Physician Discharge Summary  Rachel Swanson FAO:[id number] DOB: 1976/04/07 DOA: 03/13/2022  PCP: Pleas Koch, NP  Admit date: 03/13/2022 Discharge date: 03/14/2022  Recommendations for Outpatient Follow-up:     Follow-up Information     [name], MD. Go on 04/05/2022.   Specialty: General Surgery Why: at 10:15.  Please arrive 15 minutes prior to your appointment time.  Thank you. Contact information: [address]  [place] [address] [phone]         Maczis, Carlena Hurl, [place]. Go on 05/02/2022.   Specialty: General Surgery Why: at 2:30pm for Dr. Redmond Pulling.  Please arrive 15 minutes prior to your appointment time.  Thank you. Contact information: [place] [address] [phone]                Discharge Diagnoses:  Principal Problem:   S/P gastric bypass Severe obesity (BMI 46)  Benign hypertension  Gastroesophageal reflux disease without esophagitis  Prediabetes  Surgical Procedure: Laparoscopic Roux-en-Y gastric bypass, upper endoscopy  Discharge Condition: Good Disposition: Home  Diet recommendation: Postoperative gastric bypass diet  Filed Weights   03/13/22 0835 03/13/22 0854  Weight: 126.1 kg 126.1 kg     Hospital Course:  The patient was admitted for a planned laparoscopic Roux-en-Y gastric bypass. Please see operative note. Preoperatively the patient was given 5000 units of subcutaneous heparin for DVT prophylaxis. ERAS protocol was used. Postoperative prophylactic Lovenox dosing was started on the evening of postoperative day 0.  The patient was started on ice chips and water on the evening of POD 0 which they tolerated. On postoperative day 1 The patient's diet was advanced to protein shakes which they also tolerated. On POD 1 The patient was ambulating without difficulty. Their vital signs are stable without fever or tachycardia. Their hemoglobin had remained stable. The patient had received  discharge instructions and counseling. They were deemed stable for discharge.  BP (!) 153/71 (BP Location: Right Arm)   Pulse 63   Temp 98.2 F (36.8 C) (Oral)   Resp 18   Ht '5\' 5"'$  (1.651 m)   Wt 126.1 kg   LMP 02/21/2022   SpO2 100%   BMI 46.26 kg/m   Gen: alert, NAD, non-toxic appearing Pupils: equal, no scleral icterus Pulm: Lungs clear to auscultation, symmetric chest rise CV: regular rate and rhythm Abd: soft, min tender, nondistended. No cellulitis. No incisional hernia Ext: no edema, no calf tenderness Skin: no rash, no jaundice  Discharge Instructions  Discharge Instructions     Ambulate hourly while awake   Complete by: As directed    Call MD for:  difficulty breathing, headache or visual disturbances   Complete by: As directed    Call MD for:  persistant dizziness or light-headedness   Complete by: As directed    Call MD for:  persistant nausea and vomiting   Complete by: As directed    Call MD for:  redness, tenderness, or signs of infection (pain, swelling, redness, odor or green/yellow discharge around incision site)   Complete by: As directed    Call MD for:  severe uncontrolled pain   Complete by: As directed    Call MD for:  temperature >101 F   Complete by: As directed    Diet bariatric full liquid   Complete by: As directed    Discharge instructions   Complete by: As directed    See bariatric discharge instructions   Incentive spirometry   Complete by: As  directed    Perform hourly while awake      Allergies as of 03/14/2022       Reactions   Ace Inhibitors Cough        Medication List     TAKE these medications    acetaminophen 500 MG tablet Commonly known as: TYLENOL Take 2 tablets (1,000 mg total) by mouth every 8 (eight) hours for 5 days.   diclofenac Sodium 1 % Gel Commonly known as: VOLTAREN Apply 2 g topically 3 (three) times daily as needed (pain). Notes to patient: Avoid NSAIDs after surgery due to risk of GI (stomach)  ulcer.   fluticasone 50 MCG/ACT nasal spray Commonly known as: FLONASE PLACE 1 SPRAY INTO BOTH NOSTRILS 2 (TWO) TIMES DAILY. FOR ALLERGIES. What changed:  when to take this additional instructions   hydrochlorothiazide 12.5 MG tablet Commonly known as: HYDRODIURIL Take 1 tablet (12.5 mg total) by mouth daily. For blood pressure. Notes to patient: Monitor Blood Pressure Daily and keep a log for primary care physician.  Monitor for symptoms of dehydration.  You may need to make changes to your medications with rapid weight loss.     levocetirizine 5 MG tablet Commonly known as: XYZAL TAKE 1 TABLET (5 MG TOTAL) BY MOUTH EVERY EVENING. FOR COUGH/ALLERGIES   montelukast 10 MG tablet Commonly known as: SINGULAIR TAKE 1 TABLET (10 MG TOTAL) BY MOUTH AT BEDTIME. FOR ALLERGIES. What changed:  when to take this additional instructions   olmesartan 40 MG tablet Commonly known as: BENICAR TAKE 1 TABLET (40 MG TOTAL) BY MOUTH DAILY. FOR BLOOD PRESSURE. Notes to patient: Monitor Blood Pressure Daily and keep a log for primary care physician.  You may need to make changes to your medications with rapid weight loss.     omeprazole 20 MG capsule Commonly known as: PRILOSEC Take 1 capsule (20 mg total) by mouth daily. For heartburn.   ondansetron 4 MG disintegrating tablet Commonly known as: ZOFRAN-ODT Take 1 tablet (4 mg total) by mouth every 6 (six) hours as needed for nausea or vomiting.   tetrahydrozoline 0.05 % ophthalmic solution Place 1 drop into both eyes daily as needed (dry/irritated eyes).   traMADol 50 MG tablet Commonly known as: ULTRAM Take 1 tablet (50 mg total) by mouth every 6 (six) hours as needed (pain).        Follow-up Information     [name], MD. Go on 04/05/2022.   Specialty: General Surgery Why: at 10:15.  Please arrive 15 minutes prior to your appointment time.  Thank you. Contact information: [address] [place]  [address] [phone]         Maczis, Carlena Hurl, [place]. Go on 05/02/2022.   Specialty: General Surgery Why: at 2:30pm for Dr. Redmond Pulling.  Please arrive 15 minutes prior to your appointment time.  Thank you. Contact information: [address] [place] [address] [phone]                  The results of significant diagnostics from this hospitalization (including imaging, microbiology, ancillary and laboratory) are listed below for reference.    Significant Diagnostic Studies: No results found.  Labs: Basic Metabolic Panel: Recent Labs  Lab 03/09/22 1546 03/14/22 0429  NA 139 139  K 3.4* 4.5  CL 106 106  CO2 26 26  GLUCOSE 98 162*  BUN 12 9  CREATININE 0.92 0.97  CALCIUM 9.6 9.2   Liver Function Tests: Recent Labs  Lab 03/09/22 1546 03/14/22 0429  AST 16 30  ALT 17 27  ALKPHOS 64 56  BILITOT 0.8 0.5  PROT 7.8 7.3  ALBUMIN 3.8 3.4*    CBC: Recent Labs  Lab 03/09/22 1546 03/13/22 1516 03/14/22 0429  WBC 5.7 8.0 13.6*  NEUTROABS 2.9  --  11.6*  HGB 12.9 11.3* 12.6  HCT 39.7 35.5* 39.0  MCV 89.8 92.7 90.9  PLT 278 236 273    CBG: Recent Labs  Lab 03/09/22 1553 03/13/22 0830  GLUCAP 101* 107*    Principal Problem:   S/P gastric bypass   Time coordinating discharge: 20 min  Signed:  Gayland Curry, MD [facility], [place] [phone] 03/14/2022, 2:44 PM

## 2022-03-14 NOTE — Clinical Social Work Note (Signed)
  Transition of Care [facility]) Screening Note   Patient Details  Name: Rachel Swanson Date of Birth: 11-May-1976   Transition of Care [facility]) CM/SW Contact:    Ross Ludwig, LCSW Phone Number: 03/14/2022, 3:40 PM    Transition of Care Department [facility]) has reviewed patient and no TOC needs have been identified at this time. We will continue to monitor patient advancement through interdisciplinary progression rounds. If new patient transition needs arise, please place a TOC consult.

## 2022-03-16 ENCOUNTER — Telehealth (HOSPITAL_COMMUNITY): Payer: Self-pay | Admitting: *Deleted

## 2022-03-16 NOTE — Telephone Encounter (Signed)
1. Tell me about your pain and pain management?  Pt states minimal pain. Pt reports relief with Tylenol Pt can tolerate protein shakes and water.    2. Let's talk about fluid intake. How much total fluid are you taking in?  Pt states that s/he is getting in at least 40oz of fluid including protein shakes, bottled water, and broth  Pt states that s/he is working to meet goal of 64 oz of fluid today. Pt has been able to consume approx. 40-50 oz of fluid per day since surgery. Pt has been instructed to focus more on protein and then fluids.  3. How much protein have you taken in the last 2 days?  Pt states s/he is working on meeting his/her goal of 60g of protein each day with the protein shakes. She likes the Atkins brand  4. Have you had nausea? Tell me about when have experienced nausea and what you did to help?  Pt denies nausea.  5. Has the frequency or color changed with your urine?  Pt states that s/he is urinating "fine" with clear urine.  6. Tell me what your incisions look like?  "Incisions look fine". Pt took outer bandage off and left the steristrips on.  Pt instructed to let it the steristrips fall off on their own.   7. Have you been passing gas? BM?  Pt states that s/he is having BMs. Last BM 03/16/22.  Pt instructed to take either Miralax or MoM as instructed per "Gastric Bypass/Sleeve Discharge Home Care Instructions". Pt to call surgeon's office if not able to have BM with medication.  8. If a problem or question were to arise who would you call? Do you know contact numbers for [place], [facility], and [facility]?  Pt knows to call [facility] for surgical, [facility] for nutrition, and [facility] for non-urgent questions or concerns.  9. How has the walking going?  Pt states s/he is walking around and able to be active without difficulty.  10. Are you still using your incentive spirometer? If so, how often?  Pt states that s/he forgot to do the incentive spirometer. She was encouraged and  reminded to complete to keep lungs open. She has stated she has been breathing fine with no difficulty.  11. How are your vitamins and calcium going? How are you taking them?   Pt states that s/he will begin the supplements soon. She had them shipped in from [employer].

## 2022-03-22 ENCOUNTER — Telehealth: Payer: Self-pay

## 2022-03-22 NOTE — Telephone Encounter (Signed)
Transition Care Management Follow-up Telephone Call Date of discharge and from where: 03/14/22 / [facility] How have you been since you were released from the hospital? "Doing ok" Any questions or concerns? Yes.  Patient inquires if she should follow up with her primary care provider.   She states she has a scheduled follow up appointment with her surgeon.  RNCM advised patient to contact her primary care provider through Mychart or call office to inquire if follow up visit is needed.    Items Reviewed: Did the pt receive and understand the discharge instructions provided? Yes  Medications obtained and verified? Yes  Other?  N/a Any new allergies since your discharge? No  Dietary orders reviewed? Yes Do you have support at home? Yes   Home Care and Equipment/Supplies: Were home health services ordered? no If so, what is the name of the agency? N/a  Has the agency set up a time to come to the patient's home? no Were any new equipment or medical supplies ordered?  No What is the name of the medical supply agency? N/a Were you able to get the supplies/equipment? not applicable Do you have any questions related to the use of the equipment or supplies? No  Functional Questionnaire: (I = Independent and D = Dependent) ADLs: I  Bathing/Dressing- I  Meal Prep- I  Eating- I  Maintaining continence- I  Transferring/Ambulation- I  Managing Meds- I  Follow up appointments reviewed:  PCP Hospital f/u appt confirmed? No  Scheduled to see:  Patient advised to contact primary provider to inquire if follow up is needed.  [facility] f/u appt confirmed? Yes  Scheduled to see: Dr. [name] on 04/05/22 @ 10:15am. Are transportation arrangements needed? No  If their condition worsens, is the pt aware to call PCP or go to the Emergency Dept.? Yes Was the patient provided with contact information for the PCP's office or ED? Yes Was to pt encouraged to call back with questions or  concerns? Yes  Quinn Plowman RN,BSN,CCM RN Case Manager [facility]  [phone]

## 2022-03-27 ENCOUNTER — Encounter: Payer: BC Managed Care – PPO | Attending: General Surgery | Admitting: Dietician

## 2022-03-27 ENCOUNTER — Encounter: Payer: Self-pay | Admitting: Dietician

## 2022-03-27 DIAGNOSIS — Z6841 Body Mass Index (BMI) 40.0 and over, adult: Secondary | ICD-10-CM | POA: Diagnosis not present

## 2022-03-27 DIAGNOSIS — R5383 Other fatigue: Secondary | ICD-10-CM | POA: Insufficient documentation

## 2022-03-27 DIAGNOSIS — J3089 Other allergic rhinitis: Secondary | ICD-10-CM

## 2022-03-27 DIAGNOSIS — Z713 Dietary counseling and surveillance: Secondary | ICD-10-CM | POA: Diagnosis not present

## 2022-03-27 DIAGNOSIS — R7303 Prediabetes: Secondary | ICD-10-CM | POA: Diagnosis not present

## 2022-03-27 DIAGNOSIS — E669 Obesity, unspecified: Secondary | ICD-10-CM

## 2022-03-27 DIAGNOSIS — E079 Disorder of thyroid, unspecified: Secondary | ICD-10-CM

## 2022-03-27 DIAGNOSIS — K219 Gastro-esophageal reflux disease without esophagitis: Secondary | ICD-10-CM | POA: Diagnosis not present

## 2022-03-27 DIAGNOSIS — I10 Essential (primary) hypertension: Secondary | ICD-10-CM | POA: Insufficient documentation

## 2022-03-27 NOTE — Progress Notes (Signed)
2 Week Post-Operative Nutrition Class   Patient was seen on 03/27/2022 for Post-Operative Nutrition education at the [facility].    Surgery date: 03/13/2022 Surgery type: RYGB  NUTRITION ASSESSMENT   Anthropometrics  Start weight at [facility]: 279.8 lbs (date: 09/21/2021)  Height: 64.5 in Weight today: 257.8 lbs. BMI: 42.90 kg/m2     Clinical  Medical hx: GERD, Hypertension, prediabetes Medications: Benicar, hydrodiuril, voltaren, Singulair, inhaler, Flonase, xyzal, Prilosec  Labs: see list Notable signs/symptoms: reflux  Any previous deficiencies? Yes, vitamin D   Body Composition Scale 03/27/2022  Current Body Weight 257.8  Total Body Fat % 45.9  Visceral Fat 16  Fat-Free Mass % 54.0   Total Body Water % 41.5  Muscle-Mass lbs 32.0  BMI 42.6  Body Fat Displacement          Torso  lbs 73.4         Left Leg  lbs 14.6         Right Leg  lbs 14.6         Left Arm  lbs 7.3         Right Arm   lbs 7.3      The following the learning objectives were met by the patient during this course: Identifies Phase 3 (Soft, High Proteins) Dietary Goals and will begin from 2 weeks post-operatively to 2 months post-operatively Identifies appropriate sources of fluids and proteins  Identifies appropriate fat sources and healthy verses unhealthy fat types   States protein recommendations and appropriate sources post-operatively Identifies the need for appropriate texture modifications, mastication, and bite sizes when consuming solids Identifies appropriate fat consumption and sources Identifies appropriate multivitamin and calcium sources post-operatively Describes the need for physical activity post-operatively and will follow MD recommendations States when to call healthcare provider regarding medication questions or post-operative complications   Handouts given during class include: Phase 3A: Soft, High Protein Diet Handout Phase 3 High Protein  Meals Healthy Fats   Follow-Up Plan: Patient will follow-up at [facility] in 6 weeks for 2 month post-op nutrition visit for diet advancement per MD.

## 2022-03-29 MED ORDER — MONTELUKAST SODIUM 10 MG PO TABS: 10.0000 mg | ORAL_TABLET | Freq: Every day | ORAL | 0 refills | Status: DC

## 2022-03-31 ENCOUNTER — Ambulatory Visit: Payer: BC Managed Care – PPO | Admitting: Primary Care

## 2022-03-31 ENCOUNTER — Encounter: Payer: Self-pay | Admitting: Primary Care

## 2022-03-31 DIAGNOSIS — Z9884 Bariatric surgery status: Secondary | ICD-10-CM | POA: Diagnosis not present

## 2022-03-31 DIAGNOSIS — R0609 Other forms of dyspnea: Secondary | ICD-10-CM | POA: Diagnosis not present

## 2022-03-31 HISTORY — DX: Other forms of dyspnea: R06.09

## 2022-03-31 LAB — CBC WITH DIFFERENTIAL/PLATELET
Basophils Absolute: 0.1 10*3/uL (ref 0.0–0.1)
Basophils Relative: 1.3 % (ref 0.0–3.0)
Eosinophils Absolute: 0.2 10*3/uL (ref 0.0–0.7)
Eosinophils Relative: 4 % (ref 0.0–5.0)
HCT: 42.9 % (ref 36.0–46.0)
Hemoglobin: 13.9 g/dL (ref 12.0–15.0)
Lymphocytes Relative: 40.5 % (ref 12.0–46.0)
Lymphs Abs: 1.7 10*3/uL (ref 0.7–4.0)
MCHC: 32.4 g/dL (ref 30.0–36.0)
MCV: 90.6 fl (ref 78.0–100.0)
Monocytes Absolute: 0.3 10*3/uL (ref 0.1–1.0)
Monocytes Relative: 7.5 % (ref 3.0–12.0)
Neutro Abs: 1.9 10*3/uL (ref 1.4–7.7)
Neutrophils Relative %: 46.7 % (ref 43.0–77.0)
Platelets: 290 10*3/uL (ref 150.0–400.0)
RBC: 4.74 Mil/uL (ref 3.87–5.11)
RDW: 14.2 % (ref 11.5–15.5)
WBC: 4.1 10*3/uL (ref 4.0–10.5)

## 2022-03-31 LAB — BASIC METABOLIC PANEL
BUN: 38 mg/dL — ABNORMAL HIGH (ref 6–23)
CO2: 23 mEq/L (ref 19–32)
Calcium: 10.5 mg/dL (ref 8.4–10.5)
Chloride: 100 mEq/L (ref 96–112)
Creatinine, Ser: 1.49 mg/dL — ABNORMAL HIGH (ref 0.40–1.20)
GFR: 41.29 mL/min — ABNORMAL LOW (ref >60.00)
Glucose, Bld: 105 mg/dL — ABNORMAL HIGH (ref 70–99)
Potassium: 3.9 mEq/L (ref 3.5–5.1)
Sodium: 137 mEq/L (ref 135–145)

## 2022-03-31 NOTE — Assessment & Plan Note (Signed)
Doing well in terms of surgical intervention, but she does appear fatigued and unlike herself today. Reviewed surgeon's notes from 03/13/22.  Pulse oxygen today of 95% originally, inreased to 96% upon recheck. HR today of 114, improved to 110 on recheck.  Given her presentation, especially given recent surgery and lack of physical activity, will need to rule out PE/infection.  Checking labs today including CBC with diff, BMP, D-dimer.  Overall stable in office today. Await results. Strict ED precautions provided.

## 2022-03-31 NOTE — Patient Instructions (Signed)
Stop by the lab prior to leaving today. I will notify you of your results once received.   It was a pleasure to see you today!  

## 2022-03-31 NOTE — Progress Notes (Signed)
Subjective:    Patient ID: Rachel Swanson, female    DOB: Dec 02, 1975, 46 y.o.   MRN: [id number]  HPI  Rachel Swanson is a very pleasant 46 y.o. female with a history of hypertension, GERD, morbid obesity, prediabetes, chronic bilateral knee pain, status post gastric bypass who presents today for follow-up after gastric bypass.  She is S/P gastric bypass surgery, Roux-en-Y, from 03/13/22 per Dr. Redmond Pulling.  No apparent complications.   Since her surgery she has lost 21 pounds, according to documented weights in EMR. She was speaking with the nurse a [facility] who recommended post operative follow up with PCP.  Today she endorses generalized weakness and feeling faint since her surgery. She isn't sure if this is typical. She's also noticed "feeling winded" with mild to moderate exertion such as climbing stairs in her home. Prior to surgery she had no problems with dyspnea upon exertion. She denies dyspnea with normal ADL's.   She met with a nutritionist earlier this week, is in phase three of meal plans. She is working to take in appropriate protein which has been challenging. She is working to get in enough liquids. She is having bowel movements once daily, soft. She's mostly been sedentary, has left her home twice since surgery. She is on vacation this week so she's focused on relaxing.   She denies abdominal pain, urinary symptoms, chest pain, chest pain with deep inspiration, lower extremity swelling, nausea/vomiting, dizziness, headaches. She's taking Tylenol for muscle and back aches. She took Tramadol once. She has an appointment to meet with her surgeon next week.   BP Readings from Last 3 Encounters:  03/31/22 126/82  03/14/22 (!) 153/71  03/09/22 (!) 152/100   Wt Readings from Last 3 Encounters:  03/31/22 257 lb (116.6 kg)  03/27/22 257 lb 12.8 oz (116.9 kg)  03/13/22 278 lb (126.1 kg)     Review of Systems  Constitutional:  Positive for fatigue.   Respiratory:  Positive for chest tightness and shortness of breath. Negative for cough.   Cardiovascular:  Negative for chest pain and palpitations.  Gastrointestinal:  Negative for abdominal pain, constipation and diarrhea.  Neurological:  Negative for dizziness and headaches.         Past Medical History:  Diagnosis Date   GERD (gastroesophageal reflux disease)    Hypertension    Pre-diabetes    Prediabetes    Seasonal allergies     Social History   Socioeconomic History   Marital status: Single    Spouse name: Not on file   Number of children: 0   Years of education: Not on file   Highest education level: Not on file  Occupational History   Occupation: Teacher  Tobacco Use   Smoking status: Never   Smokeless tobacco: Never  Vaping Use   Vaping Use: Never used  Substance and Sexual Activity   Alcohol use: Yes    Comment: 1-2 per month   Drug use: No   Sexual activity: Yes    Partners: Male    Birth control/protection: Condom  Other Topics Concern   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    Social Determinants of Health   Financial Resource Strain: Not on file  Food Insecurity: Not on file  Transportation Needs: Not on file  Physical Activity: Not on file  Stress: Not on file  Social Connections: Not on file  Intimate Partner Violence: Not on file    Past Surgical History:  Procedure Laterality Date   BUNIONECTOMY Right    GASTRIC ROUX-EN-Y N/A 03/13/2022   Procedure: LAPAROSCOPIC ROUX-EN-Y GASTRIC BYPASS WITH UPPER ENDOSCOPY;  Surgeon: [name], MD;  Location: [facility];  Service: General;  Laterality: N/A;   HIATAL HERNIA REPAIR N/A 03/13/2022   Procedure: LAPAROSCOPIC HERNIA REPAIR HIATAL;  Surgeon: [name], MD;  Location: [facility];  Service: General;  Laterality: N/A;   WISDOM TOOTH EXTRACTION      Family History  Problem Relation Age of Onset   Heart disease  Mother    Hypertension Mother    Hyperlipidemia Father    Heart attack Father    Diabetes Sister    Breast cancer Paternal Aunt    Breast cancer Cousin        paternal   Colon cancer Neg Hx    Esophageal cancer Neg Hx    Rectal cancer Neg Hx    Stomach cancer Neg Hx     Allergies  Allergen Reactions   Ace Inhibitors Cough    Current Outpatient Medications on File Prior to Visit  Medication Sig Dispense Refill   diclofenac Sodium (VOLTAREN) 1 % GEL Apply 2 g topically 3 (three) times daily as needed (pain). 100 g 2   fluticasone (FLONASE) 50 MCG/ACT nasal spray PLACE 1 SPRAY INTO BOTH NOSTRILS 2 (TWO) TIMES DAILY. FOR ALLERGIES. (Patient taking differently: Place 1 spray into both nostrils daily.) 48 mL 0   hydrochlorothiazide (HYDRODIURIL) 12.5 MG tablet Take 1 tablet (12.5 mg total) by mouth daily. For blood pressure. 90 tablet 3   levocetirizine (XYZAL) 5 MG tablet TAKE 1 TABLET (5 MG TOTAL) BY MOUTH EVERY EVENING. FOR COUGH/ALLERGIES 90 tablet 0   montelukast (SINGULAIR) 10 MG tablet Take 1 tablet (10 mg total) by mouth at bedtime. For allergies. 90 tablet 0   olmesartan (BENICAR) 40 MG tablet TAKE 1 TABLET (40 MG TOTAL) BY MOUTH DAILY. FOR BLOOD PRESSURE. 90 tablet 2   tetrahydrozoline 0.05 % ophthalmic solution Place 1 drop into both eyes daily as needed (dry/irritated eyes).     omeprazole (PRILOSEC) 20 MG capsule Take 1 capsule (20 mg total) by mouth daily. For heartburn. (Patient not taking: Reported on 03/31/2022) 90 capsule 3   No current facility-administered medications on file prior to visit.    BP 126/82   Pulse (!) 110   Temp 97.9 F (36.6 C) (Oral)   Ht $R'5\' 5"'PW$  (1.651 m)   Wt 257 lb (116.6 kg)   LMP 02/21/2022   SpO2 96%   BMI 42.77 kg/m  Objective:   Physical Exam Constitutional:      Comments: Appears tired/fatigued, slightly pale.  When asked to get up on the exam table she developed mild diaphoresis and shortness of breath.   Cardiovascular:     Rate  and Rhythm: Regular rhythm. Tachycardia present.  Pulmonary:     Effort: Pulmonary effort is normal.     Breath sounds: Normal breath sounds.  Musculoskeletal:     Cervical back: Neck supple.  Skin:    General: Skin is warm and dry.           Assessment & Plan:   Problem List Items Addressed This Visit       Other   S/P gastric bypass    Doing well in terms of surgical intervention, but she does appear fatigued and unlike herself today. Reviewed surgeon's notes from 03/13/22.  Pulse oxygen  today of 95% originally, inreased to 96% upon recheck. HR today of 114, improved to 110 on recheck.  Given her presentation, especially given recent surgery and lack of physical activity, will need to rule out PE/infection.  Checking labs today including CBC with diff, BMP, D-dimer.  Overall stable in office today. Await results. Strict ED precautions provided.       Relevant Orders   D-dimer, quantitative   CBC with Differential/Platelet   Basic metabolic panel   Exertional dyspnea    New since surgery three weeks ago.  Tachycardia with decreased oxygen saturation from baseline. Checking CBC, D-Dimer, BMP today.         Relevant Orders   D-dimer, quantitative   CBC with Differential/Platelet   Basic metabolic panel       Pleas Koch, NP

## 2022-03-31 NOTE — Assessment & Plan Note (Signed)
New since surgery three weeks ago.  Tachycardia with decreased oxygen saturation from baseline. Checking CBC, D-Dimer, BMP today.

## 2022-04-02 ENCOUNTER — Telehealth: Payer: Self-pay | Admitting: Family Medicine

## 2022-04-02 ENCOUNTER — Other Ambulatory Visit: Payer: Self-pay | Admitting: Primary Care

## 2022-04-02 DIAGNOSIS — R079 Chest pain, unspecified: Secondary | ICD-10-CM

## 2022-04-02 LAB — D-DIMER, QUANTITATIVE: D-Dimer, Quant: 2.53 mcg/mL FEU — ABNORMAL HIGH (ref <0.50)

## 2022-04-02 NOTE — Telephone Encounter (Signed)
Received call from call service on this patients d-dimer. It is elevated at 2.53. I called the patient and advised her of the elevated d-dimer and that it could indicate a blood clot as a cause of her shortness of breath. She noted that she felt fine today and felt as though she was just dehydrated the other day. She noted she has been drinking plenty of water and feels better overall. I advised that my concern is that she could have a blood clot in her lungs that is contributing to the dyspnea she was having previously and this needs to be evaluated further as soon as possible with a CT scan given that a blood clot would need to be treated as quickly as possible. I advised that she go to the ED for evaluation for this to have the CT scan completed. She asked what would happen if she was not able to go today for evaluation. I advised I would send my note to her PCP so she was aware of the d-dimer elevation and that they may be able to arrange for a scan through the office if she does not end up going to the ED, though that my recommendation is for her to be evaluated in the ED for this today. Advised if she did not go today to the ED and subsequently developed chest pain or shortness of breath she would need to go to the ED right away.

## 2022-04-02 NOTE — Telephone Encounter (Signed)
Noted and appreciate Dr. Ellen Henri recommendations. Agree with all recommendations provided. See result note. We will contact patient ASAP tomorrow. Stat CTA chest ordered and pending.

## 2022-04-03 ENCOUNTER — Ambulatory Visit
Admission: RE | Admit: 2022-04-03 | Discharge: 2022-04-03 | Disposition: A | Discharge status: Home / Self Care | Payer: BC Managed Care – PPO | Source: Ambulatory Visit | Attending: Primary Care | Admitting: Primary Care

## 2022-04-03 ENCOUNTER — Telehealth: Payer: Self-pay | Admitting: Dietician

## 2022-04-03 DIAGNOSIS — R079 Chest pain, unspecified: Secondary | ICD-10-CM | POA: Insufficient documentation

## 2022-04-03 MED ORDER — IOHEXOL 350 MG/ML SOLN: 60.0000 mL | Freq: Once | INTRAVENOUS | Status: DC | PRN

## 2022-04-03 MED ORDER — IOHEXOL 350 MG/ML SOLN
75.0000 mL | Freq: Once | INTRAVENOUS | Status: AC | PRN
Start: 2022-04-03 — End: 2022-04-03
  Administered 2022-04-03: 75 mL via INTRAVENOUS

## 2022-04-03 NOTE — Telephone Encounter (Signed)
RD called pt to verify fluid intake once starting soft, solid proteins 2 week post-bariatric surgery.   Daily Fluid intake: 64 now, since Fridays Daily Protein intake: 60 grams Bowel Habits: daily  Concerns/issues:  Pt states she was dehydrated when she went to PCP, states she had a CT scan to check for blood clots. She is getting her fluids in when she has ice.

## 2022-04-03 NOTE — Telephone Encounter (Addendum)
See below both access nurse notes for Dr Tharon Aquas note and Gentry Fitz NP note.   [place] Night - Client TELEPHONE ADVICE RECORD AccessNurse Patient Name: Rachel Swanson NS-Weil Gender: Female DOB: 1976/03/19 Age: 46 Y 91 M 28 D Return Phone Number: [phone] (Primary) Address: City/ State/ Zip: [place] [address] Client [place] Night - Client Client Site [place] Provider Alma Friendly - NP Contact Type Call Who Is Calling Lab / Radiology Lab Name [facility] Phone Number [phone] Lab Tech Name [facility] Lab Reference Number [id number] W Chief Complaint Lab Result (Critical or Stat) Call Type Lab Send to RN Reason for Call Report lab results Initial Comment Caller has a critical lab to report. Translation No Nurse Assessment Nurse: Geraldine Contras, RN, [place] Date/Time (Eastern Time): 04/02/2022 3:09:55 PM Is there an on-call provider listed? ---Yes Please list name of person reporting value (Lab Employee) and a contact number. ---Jenny Reichmann [phone] Please document the following items: Lab name Lab value (read back to lab to verify) Reference range for lab value Date and time blood was drawn Collect time of birth for bilirubin results ---D-dimer 2.53 FEU less than 0.50 [birth year] @ 1324 Please collect the patient contact information from the lab. (name, phone number and address) ---[name] [phone] Disp. Time Eilene Ghazi Time) Disposition Final User 04/02/2022 3:13:17 PM Lab Call Geraldine Contras, RN, [place] Reason: collected critical lab 04/02/2022 3:15:17 PM Called On-Call Provider [place], [place], Jordan Hawks 04/02/2022 3:18:58 PM Clinical Call Yes Geraldine Contras, RN, [place] Final Disposition 04/02/2022 3:18:58 PM Clinical Call Yes Geraldine Contras, RN, Jordan Hawks PLEASE NOTE: All timestamps contained within this report are represented as [place] Standard Time. CONFIDENTIALTY NOTICE: This fax transmission is intended  only for the addressee. It contains information that is legally privileged, confidential or otherwise protected from use or disclosure. If you are not the intended recipient, you are strictly prohibited from reviewing, disclosing, copying using or disseminating any of this information or taking any action in reliance on or regarding this information. If you have received this fax in error, please notify us immediately by telephone so that we can arrange for its return to [place]. Phone: [phone], Toll-Free: [phone], Fax: [phone] Page: 2 of 2 Call Id: [id number] Paging DoctorName Phone DateTime Result/ Outcome Message Type Notes Tommi Rumps - MD [id number] 04/02/2022 3:15:17 PM Called On Call Provider - Reached Doctor Paged Tommi Rumps - MD 04/02/2022 3:18:26 PM Spoke with On Call - General Message Result give repor   [place] Night - Client TELEPHONE ADVICE RECORD AccessNurse Patient Name: Rachel Swanson N-Mura Gender: Female DOB: October 26, 1975 Age: 40 Y 8 M 28 D Return Phone Number: [phone] (Primary) Address: City/ State/ Zip: [place] Client [place] Night - Client Client Site [place] Provider Alma Friendly - NP Contact Type Call Who Is Calling Lab / Radiology Lab Name [facility] Lab Phone Number [phone] Lab Tech Name Bertis Ruddy Lab Reference Number Doren Custard [id number] W Chief Complaint Lab Result (Critical or Stat) Call Type Lab Send to RN Reason for Call Report lab results Initial Comment Caller calling with urgent lab results. Translation No Nurse Assessment Nurse: Rex Kras, RN, [place] Date/Time (Eastern Time): 04/02/2022 4:18:54 PM Confirm and document reason for call. If symptomatic, describe symptoms. ---Caller calling with urgent lab results. Does the patient have any new or worsening symptoms? ---No Nurse: Rex Kras, RN, [place] Date/Time (Eastern  Time): 04/02/2022 4:15:29 PM Is  there an on-call provider listed? ---Yes Please list name of person reporting value (Lab Employee) and a contact number. ---Lonn Georgia [phone] Please document the following items: Lab name Lab value (read back to lab to verify) Reference range for lab value Date and time blood was drawn Collect time of birth for bilirubin results ---Lab : D-Dimer 2.53 No recent results. Reference Range: <0.50 Date/Time Collected: 03/31/22 at 0949 AM Please collect the patient contact information from the lab. (name, phone number and address) ---Laveyah Oriol [phone] [address], [place], [place] [address] [place]. Time Eilene Ghazi Time) Disposition Final User 04/02/2022 4:26:40 PM Paged On Call back to Call [place], [place], [facility] 04/02/2022 4:48:28 PM Lab Call Mariel Sleet, RN, Erasmo Downer Reason: called lab for result 04/02/2022 4:48:39 PM Clinical Call Yes Mariel Sleet, RN, Erasmo Downer Final Disposition 04/02/2022 4:48:39 PM Clinical Call Yes Mariel Sleet, RN, Erasmo Downer PLEASE NOTE: All timestamps contained within this report are represented as [place] Standard Time. CONFIDENTIALTY NOTICE: This fax transmission is intended only for the addressee. It contains information that is legally privileged, confidential or otherwise protected from use or disclosure. If you are not the intended recipient, you are strictly prohibited from reviewing, disclosing, copying using or disseminating any of this information or taking any action in reliance on or regarding this information. If you have received this fax in error, please notify us immediately by telephone so that we can arrange for its return to [place]. Phone: [phone], Toll-Free: [phone], Fax: [phone] Page: 2 of 2 Call Id: [id number] Paging DoctorName Phone DateTime Result/ Outcome Message Type Notes Tommi Rumps - MD [phone] 04/02/2022 4:26:40 PM Called On Call Provider - Left Message Doctor Paged Tommi Rumps  - MD 04/02/2022 4:48:05 PM Spoke with On Call - General Message Result Advised of critical lab result

## 2022-04-03 NOTE — Telephone Encounter (Signed)
error 

## 2022-04-06 ENCOUNTER — Encounter: Payer: Self-pay | Admitting: *Deleted

## 2022-04-10 ENCOUNTER — Other Ambulatory Visit: Payer: Self-pay | Admitting: Primary Care

## 2022-04-10 DIAGNOSIS — J302 Other seasonal allergic rhinitis: Secondary | ICD-10-CM

## 2022-04-14 ENCOUNTER — Other Ambulatory Visit: Payer: Self-pay | Admitting: General Surgery

## 2022-04-14 ENCOUNTER — Other Ambulatory Visit: Payer: Self-pay | Admitting: Family Medicine

## 2022-04-14 DIAGNOSIS — E860 Dehydration: Secondary | ICD-10-CM

## 2022-04-14 DIAGNOSIS — J3089 Other allergic rhinitis: Secondary | ICD-10-CM

## 2022-04-17 ENCOUNTER — Other Ambulatory Visit: Payer: Self-pay | Admitting: Primary Care

## 2022-04-17 ENCOUNTER — Telehealth (HOSPITAL_COMMUNITY): Payer: Self-pay | Admitting: *Deleted

## 2022-04-17 DIAGNOSIS — K219 Gastro-esophageal reflux disease without esophagitis: Secondary | ICD-10-CM

## 2022-04-17 DIAGNOSIS — I10 Essential (primary) hypertension: Secondary | ICD-10-CM

## 2022-04-17 MED ORDER — OMEPRAZOLE 20 MG PO CPDR: 20.0000 mg | DELAYED_RELEASE_CAPSULE | Freq: Every day | ORAL | 2 refills | Status: DC

## 2022-04-17 NOTE — Telephone Encounter (Signed)
Followed up with pt per surgeon's request.  Pt states that she has stopped eating chicken and has only eaten fish over the weekend.  Pt states that she "feels much better and is no longer having stomach pains".  Pt did share that eating the protein foods is "getting boring", so she also purchased some unflavored protein powder to add to liquids.  She has also bought some portion control containers to assist with preventing over eating.  Reinforced education about different ways to season and prepare protein foods.  Also discussed not drinking 15 minutes before eating and waiting 30 minutes after eating and not to drink during meals.  Pt did share that she sometimes "sips water or sugar free juice" during meals to moisten mouth.  Discussed not drinking fruit juice and only clear and full liquid options listed on Phase 2 Food Plan sheet given at discharge.  Pt states that she feels much better and may not need IV fluids.  Encouraged pt to call [facility] as instructed by Dr. Redmond Pulling on Friday to f/u and to refer to Hydration Action Plan on discharge folder for s/sx of dehydration.  Will continue to monitor as needed.

## 2022-04-19 ENCOUNTER — Other Ambulatory Visit: Payer: Self-pay | Admitting: Primary Care

## 2022-04-19 DIAGNOSIS — I10 Essential (primary) hypertension: Secondary | ICD-10-CM

## 2022-04-25 ENCOUNTER — Ambulatory Visit
Admission: RE | Admit: 2022-04-25 | Discharge: 2022-04-25 | Disposition: A | Discharge status: Home / Self Care | Payer: BC Managed Care – PPO | Source: Ambulatory Visit | Attending: Primary Care | Admitting: Primary Care

## 2022-04-25 DIAGNOSIS — E041 Nontoxic single thyroid nodule: Secondary | ICD-10-CM

## 2022-04-25 DIAGNOSIS — E079 Disorder of thyroid, unspecified: Secondary | ICD-10-CM | POA: Insufficient documentation

## 2022-05-08 ENCOUNTER — Encounter: Payer: BC Managed Care – PPO | Attending: General Surgery | Admitting: Skilled Nursing Facility1

## 2022-05-08 ENCOUNTER — Encounter: Payer: Self-pay | Admitting: Skilled Nursing Facility1

## 2022-05-08 DIAGNOSIS — E669 Obesity, unspecified: Secondary | ICD-10-CM | POA: Diagnosis present

## 2022-05-08 NOTE — Progress Notes (Signed)
Bariatric Nutrition Follow-Up Visit Medical Nutrition Therapy    NUTRITION ASSESSMENT    Anthropometrics  Start weight at [facility]: 279.8 lbs (date: 09/21/2021)  Weight today: 250.3  Sx date: RYGB; 03/13/2022   Clinical  Medical hx: GERD, Hypertension, prediabetes Medications: Benicar, hydrodiuril, voltaren, Singulair, inhaler, Flonase, xyzal, Prilosec  Labs: see list Notable signs/symptoms: reflux  Any previous deficiencies? Yes, vitamin D   Body Composition Scale 03/27/2022 05/08/2022  Current Body Weight 257.8 250.3  Total Body Fat % 45.9 44.7  Visceral Fat 16 15  Fat-Free Mass % 54.0 55.2   Total Body Water % 41.5 42.1  Muscle-Mass lbs 32.0 32.6  BMI 42.6 40.8  Body Fat Displacement           Torso  lbs 73.4 69.3         Left Leg  lbs 14.6 13.8         Right Leg  lbs 14.6 13.8         Left Arm  lbs 7.3 6.9         Right Arm   lbs 7.3 6.9     Lifestyle & Dietary Hx  Pt states did end up with dehydration stating she just did not have a thirst so she did not drink.   Pt states she did try alkaline water which is going better.  Pt states she did pass out at her house and has learned she needs to eat and drink even if she does not have the cue form her body.  Pt states she is working on understanding her cues and what to look out for.  Pt states she knows humus and tortilla were not in the phases but added it in due to feeling frustrated and tired.  Pt states meats do not agree but it is getting better.   Pt states she is really tired of only eating protein.   Pt states she wants to add in fruit.    Estimated daily fluid intake: 32 oz Estimated daily protein intake: 80 g Supplements: multi and calcium Current average weekly physical activity: ADL's; plans to start swimming   24-Hr Dietary Recall First Meal: egg + [place] bacon + cheese + yogurt + chia seed Snack:  protein shake  Second Meal: tuna + tortilla + humus or yogurt Snack:  nuts Third Meal: chicken and  cottage cheese or lentils and lamb or beef + cheese + beans Snack: yogurt  Beverages: green tea, water + flavorings, water, unsweet, light apple juice (state she got it when feeling constipated), sparkling water  Post-Op Goals/ Signs/ Symptoms Using straws: no Drinking while eating: no Chewing/swallowing difficulties: no Changes in vision: no Changes to mood/headaches: no Hair loss/changes to skin/nails: no Difficulty focusing/concentrating: no Sweating: no Limb weakness: no Dizziness/lightheadedness: no Palpitations: no  Carbonated/caffeinated beverages: no N/V/D/C/Gas: no Abdominal pain: no Dumping syndrome: no    NUTRITION DIAGNOSIS  Overweight/obesity ([place]-3.3) related to past poor dietary habits and physical inactivity as evidenced by completed bariatric surgery and following dietary guidelines for continued weight loss and healthy nutrition status.     NUTRITION INTERVENTION Nutrition counseling (C-1) and education (E-2) to facilitate bariatric surgery goals, including: Diet advancement to the next phase (phase 4) now including non starchy vegetables  The importance of consuming adequate calories as well as certain nutrients daily due to the body's need for essential vitamins, minerals, and fats The importance of daily physical activity and to reach a goal of at least 150 minutes of moderate to  vigorous physical activity weekly (or as directed by their physician) due to benefits such as increased musculature and improved lab values The importance of intuitive eating specifically learning hunger-satiety cues and understanding the importance of learning a new body: The importance of mindful eating to avoid grazing behaviors   Goals: -Continue to aim for a minimum of 64 fluid ounces 7 days a week with at least 30 ounces being plain water  -Eat non-starchy vegetables 2 times a day 7 days a week  -Start out with soft cooked vegetables today and tomorrow; if tolerated begin to  eat raw vegetables or cooked including salads  -Eat your 3 ounces of protein first then start in on your non-starchy vegetables; once you understand how much of your meal leads to satisfaction and not full while still eating 3 ounces of protein and non-starchy vegetables you can eat them in any order   -Continue to aim for 30 minutes of activity at least 5 times a week  -Do NOT cook with/add to your food: alfredo sauce, cheese sauce, barbeque sauce, ketchup, fat back, butter, bacon grease, grease, Crisco, OR SUGAR   Handouts Provided Include  Phase 4  Learning Style & Readiness for Change Teaching method utilized: Visual & Auditory  Demonstrated degree of understanding via: Teach Back  Readiness Level: contemplative  Barriers to learning/adherence to lifestyle change: learning curve  RD's Notes for Next Visit Assess adherence to pt chosen goals    MONITORING & EVALUATION Dietary intake, weekly physical activity, body weight  Next Steps Patient is to follow-up in 3 months

## 2022-05-16 ENCOUNTER — Ambulatory Visit
Admission: RE | Admit: 2022-05-16 | Discharge: 2022-05-16 | Disposition: A | Discharge status: Home / Self Care | Payer: BC Managed Care – PPO | Source: Ambulatory Visit | Attending: Primary Care | Admitting: Primary Care

## 2022-05-16 DIAGNOSIS — E669 Obesity, unspecified: Secondary | ICD-10-CM | POA: Diagnosis not present

## 2022-05-16 DIAGNOSIS — E041 Nontoxic single thyroid nodule: Secondary | ICD-10-CM

## 2022-05-18 LAB — CYTOLOGY - NON PAP

## 2022-06-09 ENCOUNTER — Other Ambulatory Visit: Payer: Self-pay | Admitting: Primary Care

## 2022-06-09 DIAGNOSIS — I10 Essential (primary) hypertension: Secondary | ICD-10-CM

## 2022-07-12 ENCOUNTER — Other Ambulatory Visit: Payer: Self-pay | Admitting: Primary Care

## 2022-07-12 DIAGNOSIS — J3089 Other allergic rhinitis: Secondary | ICD-10-CM

## 2022-07-13 ENCOUNTER — Other Ambulatory Visit: Payer: Self-pay | Admitting: Primary Care

## 2022-07-13 DIAGNOSIS — J302 Other seasonal allergic rhinitis: Secondary | ICD-10-CM

## 2022-07-19 ENCOUNTER — Other Ambulatory Visit: Payer: Self-pay | Admitting: Primary Care

## 2022-07-19 DIAGNOSIS — Z1231 Encounter for screening mammogram for malignant neoplasm of breast: Secondary | ICD-10-CM

## 2022-08-09 ENCOUNTER — Encounter: Payer: Self-pay | Admitting: Skilled Nursing Facility1

## 2022-08-09 ENCOUNTER — Encounter: Payer: BC Managed Care – PPO | Attending: General Surgery | Admitting: Skilled Nursing Facility1

## 2022-08-09 DIAGNOSIS — Z6841 Body Mass Index (BMI) 40.0 and over, adult: Secondary | ICD-10-CM | POA: Diagnosis not present

## 2022-08-09 DIAGNOSIS — E669 Obesity, unspecified: Secondary | ICD-10-CM | POA: Insufficient documentation

## 2022-08-09 NOTE — Progress Notes (Signed)
Bariatric Nutrition Follow-Up Visit Medical Nutrition Therapy    NUTRITION ASSESSMENT    Anthropometrics  Start weight at [facility]: 279.8 lbs (date: 09/21/2021)  Weight today: 46  Sx date: RYGB; 03/13/2022   Clinical  Medical hx: GERD, Hypertension, prediabetes Medications: Benicar, hydrodiuril, voltaren, Singulair, inhaler, Flonase, xyzal, Prilosec  Labs: see list Notable signs/symptoms: reflux  Any previous deficiencies? Yes, vitamin D   Body Composition Scale 03/27/2022 05/08/2022 08/09/2022  Current Body Weight 257.8 250.3 233  Total Body Fat % 45.9 44.7 42.8  Visceral Fat '16 15 14  '$ Fat-Free Mass % 54.0 55.2 57.1   Total Body Water % 41.5 42.1 43  Muscle-Mass lbs 32.0 32.6 32.4  BMI 42.6 40.8 37.9  Body Fat Displacement            Torso  lbs 73.4 69.3 61.8         Left Leg  lbs 14.6 13.8 12.3         Right Leg  lbs 14.6 13.8 12.3         Left Arm  lbs 7.3 6.9 6.1         Right Arm   lbs 7.3 6.9 6.1     Lifestyle & Dietary Hx   Pt states she does listen to her bodies cues. Pt states she stops eating at 7 or before 7pm.  Pt states she did buy a piture to put hot herbal fruit tea in so thinks if she starts doing that she will get more fluid in.    Estimated daily fluid intake: 32 oz Estimated daily protein intake: 80 g Supplements: multi and calcium Current average weekly physical activity: elliptical 2 times a week 10 minutes, treadmill 10 minutes, light weights 2 times a week  24-Hr Dietary Recall: wakes at 5-5:30 First Meal: egg and sausage or protein bar or protein shake Snack:  protein bar or fast food egg and sausage  Second Meal: store bought [employer] soup Snack:  nuts Third Meal: chicken and cottage cheese or lentils and lamb or beef + cheese + beans Snack: larry and lennys protein cookies Beverages: water + flavorings, water, unsweet tea, alkaline water, sparkling water   Post-Op Goals/ Signs/ Symptoms Using straws: no Drinking while eating:  no Chewing/swallowing difficulties: no Changes in vision: no Changes to mood/headaches: no Hair loss/changes to skin/nails: no Difficulty focusing/concentrating: no Sweating: no Limb weakness: no Dizziness/lightheadedness: no Palpitations: no  Carbonated/caffeinated beverages: no N/V/D/C/Gas: no Abdominal pain: no Dumping syndrome: no    NUTRITION DIAGNOSIS  Overweight/obesity ([place]-3.3) related to past poor dietary habits and physical inactivity as evidenced by completed bariatric surgery and following dietary guidelines for continued weight loss and healthy nutrition status.     NUTRITION INTERVENTION Nutrition counseling (C-1) and education (E-2) to facilitate bariatric surgery goals, including: The importance of consuming adequate calories as well as certain nutrients daily due to the body's need for essential vitamins, minerals, and fats The importance of daily physical activity and to reach a goal of at least 150 minutes of moderate to vigorous physical activity weekly (or as directed by their physician) due to benefits such as increased musculature and improved lab values The importance of intuitive eating specifically learning hunger-satiety cues and understanding the importance of learning a new body: The importance of mindful eating to avoid grazing behaviors  Encouraged patient to honor their body's internal hunger and fullness cues.  Throughout the day, check in mentally and rate hunger. Stop eating when satisfied not full regardless of how  much food is left on the plate.  Get more if still hungry 20-30 minutes later.  The key is to honor satisfaction so throughout the meal, rate fullness factor and stop when comfortably satisfied not physically full. The key is to honor hunger and fullness without any feelings of guilt or shame.  Pay attention to what the internal cues are, rather than any external factors. This will enhance the confidence you have in listening to your own body  and following those internal cues enabling you to increase how often you eat when you are hungry not out of appetite and stop when you are satisfied not full.  Encouraged pt to continue to eat balanced meals inclusive of non starchy vegetables 2 times a day 7 days a week Encouraged pt to choose lean protein sources: limiting beef, pork, sausage, hotdogs, and lunch meat Encourage pt to choose healthy fats such as plant based limiting animal fats Encouraged pt to continue to drink a minium 64 fluid ounces with half being plain water to satisfy proper hydration  Goals: Aim for 150 minutes a week of activity    Handouts Provided Include  Phase 6  Learning Style & Readiness for Change Teaching method utilized: Visual & Auditory  Demonstrated degree of understanding via: Teach Back  Readiness Level: contemplative  Barriers to learning/adherence to lifestyle change: learning curve  RD's Notes for Next Visit Assess adherence to pt chosen goals    MONITORING & EVALUATION Dietary intake, weekly physical activity, body weight  Next Steps Patient is to follow-up in 3 months

## 2022-09-14 ENCOUNTER — Ambulatory Visit
Admission: RE | Admit: 2022-09-14 | Discharge: 2022-09-14 | Disposition: A | Discharge status: Home / Self Care | Payer: BC Managed Care – PPO | Source: Ambulatory Visit | Attending: Primary Care | Admitting: Primary Care

## 2022-09-14 DIAGNOSIS — Z1231 Encounter for screening mammogram for malignant neoplasm of breast: Secondary | ICD-10-CM

## 2022-10-08 ENCOUNTER — Other Ambulatory Visit: Payer: Self-pay | Admitting: Primary Care

## 2022-10-08 DIAGNOSIS — J302 Other seasonal allergic rhinitis: Secondary | ICD-10-CM

## 2022-11-14 ENCOUNTER — Ambulatory Visit: Payer: BC Managed Care – PPO | Admitting: Obstetrics & Gynecology

## 2022-12-11 ENCOUNTER — Ambulatory Visit: Payer: BC Managed Care – PPO | Admitting: Obstetrics & Gynecology

## 2022-12-13 ENCOUNTER — Ambulatory Visit: Payer: BC Managed Care – PPO | Admitting: Skilled Nursing Facility1

## 2022-12-19 ENCOUNTER — Ambulatory Visit: Payer: BC Managed Care – PPO | Admitting: Family

## 2022-12-19 ENCOUNTER — Ambulatory Visit: Payer: BC Managed Care – PPO | Admitting: Skilled Nursing Facility1

## 2022-12-19 ENCOUNTER — Encounter: Payer: Self-pay | Admitting: Family

## 2022-12-19 VITALS — BP 128/72 | HR 92 | Temp 97.9°F | Ht 65.5 in | Wt 208.2 lb

## 2022-12-19 DIAGNOSIS — M545 Low back pain, unspecified: Secondary | ICD-10-CM

## 2022-12-19 DIAGNOSIS — M62838 Other muscle spasm: Secondary | ICD-10-CM | POA: Diagnosis not present

## 2022-12-19 HISTORY — DX: Low back pain, unspecified: M54.50

## 2022-12-19 MED ORDER — DEXAMETHASONE SODIUM PHOSPHATE 10 MG/ML IJ SOLN
10.0000 mg | Freq: Once | INTRAMUSCULAR | Status: AC
  Administered 2022-12-19: 10 mg via INTRAMUSCULAR

## 2022-12-19 MED ORDER — CYCLOBENZAPRINE HCL 10 MG PO TABS: ORAL_TABLET | ORAL | 0 refills | Status: DC

## 2022-12-19 MED ORDER — PREDNISONE 10 MG (21) PO TBPK: ORAL_TABLET | ORAL | 0 refills | Status: DC

## 2022-12-19 NOTE — Progress Notes (Signed)
Established Patient Office Visit  Subjective:   Patient ID: Rachel Swanson, female    DOB: 24-Dec-1975  Age: 47 y.o. MRN: [id number]  CC:  Chief Complaint  Patient presents with   Back Pain    HPI: Rachel Swanson is a 47 y.o. female presenting on 12/19/2022 for Back Pain   Back Pain    While cleaning her house last week which she thinks might have aggravated her back. She went to put her pants on and her lower back felt a twinge, and felt awful. Since then is in excruciating pain. Worse with bending over and external rotation. The only other time she experienced this pain was a few years ago when going to the gym. Does feel muscular, felt a little pop when it happened.    Lab Results  Component Value Date   HGBA1C 5.7 (H) 03/09/2022        ROS: Negative unless specifically indicated above in HPI.   Relevant past medical history reviewed and updated as indicated.   Allergies and medications reviewed and updated.   Current Outpatient Medications:    cyclobenzaprine (FLEXERIL) 10 MG tablet, Take 1/2 to one tablet Q8H prn muscle spasm, Disp: 30 tablet, Rfl: 0   diclofenac Sodium (VOLTAREN) 1 % GEL, Apply 2 g topically 3 (three) times daily as needed (pain)., Disp: 100 g, Rfl: 2   fluticasone (FLONASE) 50 MCG/ACT nasal spray, PLACE 1 SPRAY INTO BOTH NOSTRILS 2 (TWO) TIMES DAILY. FOR ALLERGIES., Disp: 48 mL, Rfl: 0   hydrochlorothiazide (HYDRODIURIL) 12.5 MG tablet, TAKE 1 TABLET BY MOUTH EVERY DAY FOR BLOOD PRESSURE, Disp: 90 tablet, Rfl: 2   levocetirizine (XYZAL) 5 MG tablet, TAKE 1 TABLET (5 MG TOTAL) BY MOUTH EVERY EVENING. FOR COUGH/ALLERGIES, Disp: 90 tablet, Rfl: 2   montelukast (SINGULAIR) 10 MG tablet, TAKE 1 TABLET (10 MG TOTAL) BY MOUTH AT BEDTIME. FOR ALLERGIES., Disp: 90 tablet, Rfl: 1   olmesartan (BENICAR) 40 MG tablet, TAKE 1 TABLET (40 MG TOTAL) BY MOUTH DAILY. FOR BLOOD PRESSURE., Disp: 90 tablet, Rfl: 2   omeprazole (PRILOSEC) 20 MG capsule, Take 1 capsule  (20 mg total) by mouth daily. For heartburn., Disp: 90 capsule, Rfl: 2   predniSONE (STERAPRED UNI-PAK 21 TAB) 10 MG (21) TBPK tablet, Take as directed, Disp: 1 each, Rfl: 0   tetrahydrozoline 0.05 % ophthalmic solution, Place 1 drop into both eyes daily as needed (dry/irritated eyes)., Disp: , Rfl:   Current Facility-Administered Medications:    dexamethasone (DECADRON) injection 10 mg, 10 mg, Intramuscular, Once, Tanvir Hipple, FNP  Allergies  Allergen Reactions   Ace Inhibitors Cough    Objective:   BP 128/72   Pulse 92   Temp 97.9 F (36.6 C) (Temporal)   Ht 5' 5.5" (1.664 m)   Wt 208 lb 3.2 oz (94.4 kg)   SpO2 99%   BMI 34.12 kg/m    Physical Exam Constitutional:      General: She is not in acute distress.    Appearance: Normal appearance. She is normal weight. She is not ill-appearing, toxic-appearing or diaphoretic.  HENT:     Head: Normocephalic.  Cardiovascular:     Rate and Rhythm: Normal rate.  Pulmonary:     Effort: Pulmonary effort is normal.  Musculoskeletal:     Lumbar back: Spasms and tenderness present. Decreased range of motion. Negative right straight leg raise test and negative left straight leg raise test.  Neurological:     General: No focal deficit present.  Mental Status: She is alert and oriented to person, place, and time. Mental status is at baseline.  Psychiatric:        Mood and Affect: Mood normal.        Behavior: Behavior normal.        Thought Content: Thought content normal.        Judgment: Judgment normal.     Assessment & Plan:  Acute midline low back pain without sciatica Assessment & Plan: In clinic dexamethasone 10 mg  Start tomorrow prednisone pack, not today due to injection in office Flexeril muscle relaxer at nighttime prn  Back exercises as tolerated Heat to site  Lidocaine as needed   Orders: -     dexAMETHasone Sodium Phosphate -     predniSONE; Take as directed  Dispense: 1 each; Refill: 0 -      Cyclobenzaprine HCl; Take 1/2 to one tablet Q8H prn muscle spasm  Dispense: 30 tablet; Refill: 0  Muscle spasm -     Cyclobenzaprine HCl; Take 1/2 to one tablet Q8H prn muscle spasm  Dispense: 30 tablet; Refill: 0     Follow up plan: Return for f/u with primary care provider if no improvement.  Rachel Pancoast, FNP

## 2022-12-19 NOTE — Addendum Note (Signed)
Addended by: Eugenia Pancoast on: 12/19/2022 10:48 AM   Modules accepted: Level of Service

## 2022-12-19 NOTE — Patient Instructions (Signed)
  Injection in office today Tomorrow can start steroid pack.  Muscle relaxer at night as needed    Regards,   Praise Dolecki FNP-C

## 2022-12-19 NOTE — Assessment & Plan Note (Signed)
In clinic dexamethasone 10 mg  Start tomorrow prednisone pack, not today due to injection in office Flexeril muscle relaxer at nighttime prn  Back exercises as tolerated Heat to site  Lidocaine as needed

## 2022-12-20 ENCOUNTER — Other Ambulatory Visit (HOSPITAL_COMMUNITY): Payer: Self-pay

## 2023-01-01 ENCOUNTER — Encounter: Payer: BC Managed Care – PPO | Attending: Pediatrics | Admitting: Skilled Nursing Facility1

## 2023-01-01 ENCOUNTER — Encounter: Payer: Self-pay | Admitting: Skilled Nursing Facility1

## 2023-01-01 VITALS — Ht 65.0 in | Wt 207.3 lb

## 2023-01-01 DIAGNOSIS — E669 Obesity, unspecified: Secondary | ICD-10-CM | POA: Insufficient documentation

## 2023-01-01 NOTE — Progress Notes (Signed)
Bariatric Nutrition Follow-Up Visit Medical Nutrition Therapy   NUTRITION ASSESSMENT  Anthropometrics  Start weight at [facility]: 279.8 lbs (date: 09/21/2021)  Weight today: 233 pounds  Sx date: RYGB; 03/13/2022   Clinical  Medical hx: GERD, Hypertension, prediabetes Medications: Benicar, hydrodiuril, voltaren, Singulair, inhaler, Flonase, xyzal, Prilosec  Labs: see list Notable signs/symptoms: reflux  Any previous deficiencies? Yes, vitamin D   Body Composition Scale 03/27/2022 05/08/2022 08/09/2022 01/01/2023  Current Body Weight 257.8 250.3 233 207.3  Total Body Fat % 45.9 44.7 42.8 40  Visceral Fat 16 15 14 12   Fat-Free Mass % 54.0 55.2 57.1 59.9   Total Body Water % 41.5 42.1 43 44.4  Muscle-Mass lbs 32.0 32.6 32.4 31.7  BMI 42.6 40.8 37.9 34.1  Body Fat Displacement             Torso  lbs 73.4 69.3 61.8 51.3         Left Leg  lbs 14.6 13.8 12.3 10.2         Right Leg  lbs 14.6 13.8 12.3 10.2         Left Arm  lbs 7.3 6.9 6.1 5.1         Right Arm   lbs 7.3 6.9 6.1 5.1     Lifestyle & Dietary Hx  Pt states she is still struggling to get in enough water.  Pt states she tries to stop eating around 6pm-8pm.   Estimated daily fluid intake: 32-40 oz Estimated daily protein intake: 80 g Supplements: multi and calcium Current average weekly physical activity: elliptical 2 times a week 15-25 minutes, treadmill 15-25 minutes, light weights 2 times a week; every Monday a dance class for 30 minutes; 10 minutes on a shaker  24-Hr Dietary Recall: wakes at 5-5:30; 2 servings of fruit per day First Meal: protein shake or wheat germ + warm milk and fruit Snack 10:30:  salad + chicken or deli slices Second Meal:peanut butter crackers  Snack:  nuts Third Meal: chicken + rice + tomato + lettuce Snack: larry and lennys protein cookies Beverages: water + flavorings, water, unsweet tea, alkaline water, sparkling water, juice  Post-Op Goals/ Signs/ Symptoms Using straws: no Drinking  while eating: no Chewing/swallowing difficulties: no Changes in vision: no Changes to mood/headaches: no Hair loss/changes to skin/nails: no Difficulty focusing/concentrating: no Sweating: no Limb weakness: no Dizziness/lightheadedness: no Palpitations: no  Carbonated/caffeinated beverages: no N/V/D/C/Gas: no Abdominal pain: no Dumping syndrome: no    NUTRITION DIAGNOSIS  Overweight/obesity ([place]-3.3) related to past poor dietary habits and physical inactivity as evidenced by completed bariatric surgery and following dietary guidelines for continued weight loss and healthy nutrition status.     NUTRITION INTERVENTION Nutrition counseling (C-1) and education (E-2) to facilitate bariatric surgery goals, including: The importance of consuming adequate calories as well as certain nutrients daily due to the body's need for essential vitamins, minerals, and fats The importance of daily physical activity and to reach a goal of at least 150 minutes of moderate to vigorous physical activity weekly (or as directed by their physician) due to benefits such as increased musculature and improved lab values The importance of intuitive eating specifically learning hunger-satiety cues and understanding the importance of learning a new body: The importance of mindful eating to avoid grazing behaviors  Encouraged patient to honor their body's internal hunger and fullness cues.  Throughout the day, check in mentally and rate hunger. Stop eating when satisfied not full regardless of how much food is left on the  plate.  Get more if still hungry 20-30 minutes later.  The key is to honor satisfaction so throughout the meal, rate fullness factor and stop when comfortably satisfied not physically full. The key is to honor hunger and fullness without any feelings of guilt or shame.  Pay attention to what the internal cues are, rather than any external factors. This will enhance the confidence you have in listening to  your own body and following those internal cues enabling you to increase how often you eat when you are hungry not out of appetite and stop when you are satisfied not full.  Encouraged pt to continue to eat balanced meals inclusive of non starchy vegetables 2 times a day 7 days a week Encouraged pt to choose lean protein sources: limiting beef, pork, sausage, hotdogs, and lunch meat Encourage pt to choose healthy fats such as plant based limiting animal fats Encouraged pt to continue to drink a minium 64 fluid ounces with half being plain water to satisfy proper hydration  Creation of balanced and diverse meals to increase the intake of nutrient-rich foods that provide essential vitamins, minerals, fiber, and phytonutrients  Variety of Fruits and Vegetables:  Aim for a colorful array of fruits and vegetables to ensure a wide range of nutrients. Include a mix of leafy greens, berries, citrus fruits, cruciferous vegetables, and more. Whole Grains: Choose whole grains over refined grains. Examples include brown rice, quinoa, oats, whole wheat, and barley. Lean Proteins: Include lean sources of protein, such as poultry, fish, tofu, legumes, beans, lentils, and low-fat dairy products. Limit red and processed meats. Healthy Fats: Incorporate sources of healthy fats, including avocados, nuts, seeds, and olive oil. Limit saturated and trans fats found in fried and processed foods. Dairy or Dairy Alternatives: Choose low-fat or fat-free dairy products, or plant-based alternatives like almond or soy milk. Portion Control: Be mindful of portion sizes to avoid overeating. Pay attention to hunger and satisfaction cues. Limit Added Sugars: Minimize the consumption of sugary beverages, snacks, and desserts. Check food labels for added sugars and opt for natural sources of sweetness such as whole fruits. Hydration: Drink plenty of water throughout the day. Limit sugary drinks and excessive caffeine  intake. Moderate Sodium Intake: Reduce the consumption of high-sodium foods. Use herbs and spices for flavor instead of excessive salt. Meal Planning and Preparation: Plan and prepare meals ahead of time to make healthier choices more convenient. Include a mix of food groups in each meal. Limit Processed Foods: Minimize the intake of highly processed and packaged foods that are often high in added sugars, salt, and unhealthy fats. Regular Physical Activity: Combine a healthy diet with regular physical activity for overall well-being. Aim for at least 150 minutes of moderate-intensity aerobic exercise per week, along with strength training. Moderation and Balance: Enjoy treats and indulgent foods in moderation, emphasizing balance rather than strict restriction.  Handouts Previously Provided Include  Phase 6  Learning Style & Readiness for Change Teaching method utilized: Visual & Auditory  Demonstrated degree of understanding via: Teach Back  Readiness Level: contemplative  Barriers to learning/adherence to lifestyle change:   RD's Notes for Next Visit Assess adherence to pt chosen goals    MONITORING & EVALUATION Dietary intake, weekly physical activity, body weight  Next Steps Patient is to follow-up in late June

## 2023-01-11 ENCOUNTER — Other Ambulatory Visit: Payer: Self-pay | Admitting: Primary Care

## 2023-01-11 DIAGNOSIS — J3089 Other allergic rhinitis: Secondary | ICD-10-CM

## 2023-01-11 DIAGNOSIS — I10 Essential (primary) hypertension: Secondary | ICD-10-CM

## 2023-01-11 DIAGNOSIS — K219 Gastro-esophageal reflux disease without esophagitis: Secondary | ICD-10-CM

## 2023-01-11 NOTE — Telephone Encounter (Signed)
Spoke to pt, scheduled cpe for 5/16

## 2023-01-11 NOTE — Telephone Encounter (Signed)
Patient is due for CPE/follow up in early May, this will be required prior to any further refills.  Please schedule, thank you!

## 2023-01-12 ENCOUNTER — Other Ambulatory Visit: Payer: Self-pay | Admitting: Primary Care

## 2023-01-12 DIAGNOSIS — J302 Other seasonal allergic rhinitis: Secondary | ICD-10-CM

## 2023-02-01 ENCOUNTER — Encounter: Payer: Self-pay | Admitting: Primary Care

## 2023-02-01 ENCOUNTER — Ambulatory Visit (INDEPENDENT_AMBULATORY_CARE_PROVIDER_SITE_OTHER): Payer: BC Managed Care – PPO | Admitting: Primary Care

## 2023-02-01 VITALS — BP 98/60 | HR 76 | Temp 97.2°F | Ht 64.25 in | Wt 208.0 lb

## 2023-02-01 DIAGNOSIS — K219 Gastro-esophageal reflux disease without esophagitis: Secondary | ICD-10-CM | POA: Diagnosis not present

## 2023-02-01 DIAGNOSIS — I10 Essential (primary) hypertension: Secondary | ICD-10-CM

## 2023-02-01 DIAGNOSIS — Z0000 Encounter for general adult medical examination without abnormal findings: Secondary | ICD-10-CM | POA: Diagnosis not present

## 2023-02-01 DIAGNOSIS — J302 Other seasonal allergic rhinitis: Secondary | ICD-10-CM

## 2023-02-01 DIAGNOSIS — Z9884 Bariatric surgery status: Secondary | ICD-10-CM | POA: Diagnosis not present

## 2023-02-01 DIAGNOSIS — R7303 Prediabetes: Secondary | ICD-10-CM

## 2023-02-01 DIAGNOSIS — E559 Vitamin D deficiency, unspecified: Secondary | ICD-10-CM | POA: Diagnosis not present

## 2023-02-01 DIAGNOSIS — E66812 Obesity, class 2: Secondary | ICD-10-CM

## 2023-02-01 DIAGNOSIS — Z6835 Body mass index (BMI) 35.0-35.9, adult: Secondary | ICD-10-CM

## 2023-02-01 LAB — CBC
HCT: 35 % — ABNORMAL LOW (ref 36.0–46.0)
Hemoglobin: 11.5 g/dL — ABNORMAL LOW (ref 12.0–15.0)
MCHC: 33 g/dL (ref 30.0–36.0)
MCV: 93.4 fl (ref 78.0–100.0)
Platelets: 328 10*3/uL (ref 150.0–400.0)
RBC: 3.75 Mil/uL — ABNORMAL LOW (ref 3.87–5.11)
RDW: 14.7 % (ref 11.5–15.5)
WBC: 6.3 10*3/uL (ref 4.0–10.5)

## 2023-02-01 LAB — LIPID PANEL
Cholesterol: 165 mg/dL (ref 0–200)
HDL: 57.1 mg/dL (ref >39.00)
LDL Cholesterol: 88 mg/dL (ref 0–99)
NonHDL: 107.57
Total CHOL/HDL Ratio: 3
Triglycerides: 100 mg/dL (ref 0.0–149.0)
VLDL: 20 mg/dL (ref 0.0–40.0)

## 2023-02-01 LAB — COMPREHENSIVE METABOLIC PANEL
ALT: 15 U/L (ref 0–35)
AST: 14 U/L (ref 0–37)
Albumin: 3.7 g/dL (ref 3.5–5.2)
Alkaline Phosphatase: 64 U/L (ref 39–117)
BUN: 16 mg/dL (ref 6–23)
CO2: 32 mEq/L (ref 19–32)
Calcium: 9.5 mg/dL (ref 8.4–10.5)
Chloride: 101 mEq/L (ref 96–112)
Creatinine, Ser: 0.92 mg/dL (ref 0.40–1.20)
GFR: 73.21 mL/min (ref >60.00)
Glucose, Bld: 68 mg/dL — ABNORMAL LOW (ref 70–99)
Potassium: 3.8 mEq/L (ref 3.5–5.1)
Sodium: 140 mEq/L (ref 135–145)
Total Bilirubin: 0.3 mg/dL (ref 0.2–1.2)
Total Protein: 6.7 g/dL (ref 6.0–8.3)

## 2023-02-01 LAB — VITAMIN B12: Vitamin B-12: 778 pg/mL (ref 211–911)

## 2023-02-01 LAB — HEMOGLOBIN A1C: Hgb A1c MFr Bld: 5.9 % (ref 4.6–6.5)

## 2023-02-01 LAB — TSH: TSH: 0.72 u[IU]/mL (ref 0.35–5.50)

## 2023-02-01 LAB — VITAMIN D 25 HYDROXY (VIT D DEFICIENCY, FRACTURES): VITD: 42.87 ng/mL (ref 30.00–100.00)

## 2023-02-01 MED ORDER — OLMESARTAN MEDOXOMIL 20 MG PO TABS: 20.0000 mg | ORAL_TABLET | Freq: Every day | ORAL | 3 refills | Status: DC

## 2023-02-01 NOTE — Assessment & Plan Note (Signed)
Commended her on weight loss, encouraged to continue! S/P bariatric surgery.

## 2023-02-01 NOTE — Assessment & Plan Note (Signed)
Repeat A1C pending.  Commended her on weight loss! 

## 2023-02-01 NOTE — Assessment & Plan Note (Signed)
Commended her on regular exercise and her diet. Encouraged to continue.   Checking vitamin D and B12 levels.

## 2023-02-01 NOTE — Patient Instructions (Signed)
Stop by the lab prior to leaving today. I will notify you of your results once received.   We reduced the dose of your olmesartan to 20 mg daily for blood pressure. I sent a new prescription to your pharmacy.  It was a pleasure to see you today!

## 2023-02-01 NOTE — Progress Notes (Signed)
Subjective:    Patient ID: Rachel Swanson, female    DOB: 06/29/1976, 47 y.o.   MRN: [id number]  HPI  Rachel Swanson is a very pleasant 47 y.o. female who presents today for complete physical and follow up of chronic conditions.  Immunizations: -Tetanus: Completed in 2016  Diet: Fair diet.  Exercise: Regular exercise, plans on getting back to swimming.  Eye exam: Completes annually  Dental exam: Completes semi-annually    Pap Smear: 2022 Mammogram: December 2023  Colonoscopy: Completed in 2021, due 2028  BP Readings from Last 3 Encounters:  02/01/23 98/60  12/19/22 128/72  03/31/22 126/82   Wt Readings from Last 3 Encounters:  02/01/23 208 lb (94.3 kg)  01/01/23 207 lb 4.8 oz (94 kg)  12/19/22 208 lb 3.2 oz (94.4 kg)       Review of Systems  Constitutional:  Negative for unexpected weight change.  HENT:  Negative for rhinorrhea.   Respiratory:  Negative for cough and shortness of breath.   Cardiovascular:  Negative for chest pain.  Gastrointestinal:  Negative for constipation and diarrhea.  Genitourinary:  Negative for difficulty urinating.  Musculoskeletal:  Negative for arthralgias and myalgias.  Skin:  Negative for rash.  Allergic/Immunologic: Positive for environmental allergies.  Neurological:  Negative for dizziness, numbness and headaches.  Psychiatric/Behavioral:  The patient is nervous/anxious.          Past Medical History:  Diagnosis Date   Acute midline low back pain without sciatica 12/19/2022   Chronic cough 08/25/2019   Exertional dyspnea 03/31/2022   GERD (gastroesophageal reflux disease)    Hypertension    Hypokalemia 05/17/2015   Pre-diabetes    Prediabetes    Seasonal allergies     Social History   Socioeconomic History   Marital status: Single    Spouse name: Not on file   Number of children: 0   Years of education: Not on file   Highest education level: Not on file  Occupational History   Occupation: Teacher  Tobacco  Use   Smoking status: Never   Smokeless tobacco: Never  Vaping Use   Vaping Use: Never used  Substance and Sexual Activity   Alcohol use: Yes    Comment: 1-2 per month   Drug use: No   Sexual activity: Yes    Partners: Male    Birth control/protection: Condom  Other Topics Concern   Not on file  Social History Narrative   Single.   Teacher at [employer].   Enjoys exercising, visiting with her niece.    Social Determinants of Health   Financial Resource Strain: Not on file  Food Insecurity: Not on file  Transportation Needs: Not on file  Physical Activity: Not on file  Stress: Not on file  Social Connections: Not on file  Intimate Partner Violence: Not on file    Past Surgical History:  Procedure Laterality Date   BUNIONECTOMY Right    GASTRIC ROUX-EN-Y N/A 03/13/2022   Procedure: LAPAROSCOPIC ROUX-EN-Y GASTRIC BYPASS WITH UPPER ENDOSCOPY;  Surgeon: Gaynelle Adu, MD;  Location: [facility];  Service: General;  Laterality: N/A;   HIATAL HERNIA REPAIR N/A 03/13/2022   Procedure: LAPAROSCOPIC HERNIA REPAIR HIATAL;  Surgeon: Gaynelle Adu, MD;  Location: [facility];  Service: General;  Laterality: N/A;   WISDOM TOOTH EXTRACTION      Family History  Problem Relation Age of Onset   Heart disease Mother    Hypertension Mother    Hyperlipidemia Father    Heart  attack Father    Diabetes Sister    Breast cancer Paternal Aunt    Breast cancer Cousin        paternal   Colon cancer Neg Hx    Esophageal cancer Neg Hx    Rectal cancer Neg Hx    Stomach cancer Neg Hx     Allergies  Allergen Reactions   Ace Inhibitors Cough    Current Outpatient Medications on File Prior to Visit  Medication Sig Dispense Refill   diclofenac Sodium (VOLTAREN) 1 % GEL Apply 2 g topically 3 (three) times daily as needed (pain). 100 g 2   fluticasone (FLONASE) 50 MCG/ACT nasal spray PLACE 1 SPRAY INTO BOTH NOSTRILS 2 (TWO) TIMES DAILY. FOR ALLERGIES. 48 mL 0   hydrochlorothiazide (HYDRODIURIL) 12.5 MG  tablet TAKE 1 TABLET BY MOUTH EVERY DAY FOR BLOOD PRESSURE 90 tablet 2   levocetirizine (XYZAL) 5 MG tablet TAKE 1 TABLET (5 MG TOTAL) BY MOUTH EVERY EVENING. FOR COUGH/ALLERGIES 30 tablet 0   montelukast (SINGULAIR) 10 MG tablet TAKE 1 TABLET (10 MG TOTAL) BY MOUTH AT BEDTIME. FOR ALLERGIES. 30 tablet 0   cyclobenzaprine (FLEXERIL) 10 MG tablet Take 1/2 to one tablet Q8H prn muscle spasm (Patient not taking: Reported on 02/01/2023) 30 tablet 0   tetrahydrozoline 0.05 % ophthalmic solution Place 1 drop into both eyes daily as needed (dry/irritated eyes). (Patient not taking: Reported on 02/01/2023)     No current facility-administered medications on file prior to visit.    BP 98/60   Pulse 76   Temp (!) 97.2 F (36.2 C) (Temporal)   Ht 5' 4.25" (1.632 m)   Wt 208 lb (94.3 kg)   LMP 01/08/2023 (Approximate)   SpO2 100%   BMI 35.43 kg/m  Objective:   Physical Exam HENT:     Right Ear: Tympanic membrane and ear canal normal.     Left Ear: Tympanic membrane and ear canal normal.     Nose: Nose normal.  Eyes:     Conjunctiva/sclera: Conjunctivae normal.     Pupils: Pupils are equal, round, and reactive to light.  Neck:     Thyroid: No thyromegaly.  Cardiovascular:     Rate and Rhythm: Normal rate and regular rhythm.     Heart sounds: No murmur heard. Pulmonary:     Effort: Pulmonary effort is normal.     Breath sounds: Normal breath sounds. No rales.  Abdominal:     General: Bowel sounds are normal.     Palpations: Abdomen is soft.     Tenderness: There is no abdominal tenderness.  Musculoskeletal:        General: Normal range of motion.     Cervical back: Neck supple.  Lymphadenopathy:     Cervical: No cervical adenopathy.  Skin:    General: Skin is warm and dry.     Findings: No rash.  Neurological:     Mental Status: She is alert and oriented to person, place, and time.     Cranial Nerves: No cranial nerve deficit.     Deep Tendon Reflexes: Reflexes are normal and  symmetric.  Psychiatric:        Mood and Affect: Mood normal.           Assessment & Plan:  Essential hypertension Assessment & Plan: Low today.  Commended her on weight loss!  Reduce olmesartan to 20 mg daily.  Continue HCTZ 12.5 mg daily per her preference.   CMP pending.   Orders: -  Olmesartan Medoxomil; Take 1 tablet (20 mg total) by mouth daily. for blood pressure.  Dispense: 90 tablet; Refill: 3 -     CBC -     TSH -     Comprehensive metabolic panel  Gastroesophageal reflux disease without esophagitis Assessment & Plan: Controlled.  Remain off omeprazole 20 mg daily.   Prediabetes Assessment & Plan: Repeat A1C pending.  Commended her on weight loss!  Orders: -     Lipid panel -     Hemoglobin A1c  S/P gastric bypass Assessment & Plan: Commended her on regular exercise and her diet. Encouraged to continue.   Checking vitamin D and B12 levels.   Orders: -     VITAMIN D 25 Hydroxy (Vit-D Deficiency, Fractures) -     Vitamin B12  Seasonal allergies Assessment & Plan: Controlled.  Continue Singulair 10 mg HS, Xyzal 5 mg daily, Flonase PRN.   Vitamin D deficiency -     VITAMIN D 25 Hydroxy (Vit-D Deficiency, Fractures)  Class 2 severe obesity due to excess calories with serious comorbidity and body mass index (BMI) of 35.0 to 35.9 in adult [facility]) Assessment & Plan: Commended her on weight loss, encouraged to continue! S/P bariatric surgery.     Preventative health care Assessment & Plan: Immunizations UTD. Pap smear UTD. Mammogram UTD Colonoscopy UTD, due 2028  Discussed the importance of a healthy diet and regular exercise in order for weight loss, and to reduce the risk of further co-morbidity.  Exam stable. Labs pending.  Follow up in 1 year for repeat physical.          Doreene Nest, NP

## 2023-02-01 NOTE — Assessment & Plan Note (Signed)
Immunizations UTD. Pap smear UTD. Mammogram UTD Colonoscopy UTD, due 2028  Discussed the importance of a healthy diet and regular exercise in order for weight loss, and to reduce the risk of further co-morbidity.  Exam stable. Labs pending.  Follow up in 1 year for repeat physical.  

## 2023-02-01 NOTE — Assessment & Plan Note (Addendum)
Low today.  Commended her on weight loss!  Reduce olmesartan to 20 mg daily.  Continue HCTZ 12.5 mg daily per her preference.   CMP pending.

## 2023-02-01 NOTE — Assessment & Plan Note (Signed)
Controlled.  Continue Singulair 10 mg HS, Xyzal 5 mg daily, Flonase PRN.

## 2023-02-01 NOTE — Assessment & Plan Note (Signed)
Controlled.  Remain off omeprazole 20 mg daily.

## 2023-02-03 ENCOUNTER — Other Ambulatory Visit: Payer: Self-pay | Admitting: Primary Care

## 2023-02-03 DIAGNOSIS — J3089 Other allergic rhinitis: Secondary | ICD-10-CM

## 2023-02-07 ENCOUNTER — Other Ambulatory Visit: Payer: Self-pay | Admitting: Primary Care

## 2023-02-07 DIAGNOSIS — K219 Gastro-esophageal reflux disease without esophagitis: Secondary | ICD-10-CM

## 2023-02-08 ENCOUNTER — Encounter: Payer: BC Managed Care – PPO | Admitting: Primary Care

## 2023-03-13 ENCOUNTER — Encounter: Payer: BC Managed Care – PPO | Attending: General Surgery | Admitting: Skilled Nursing Facility1

## 2023-03-13 ENCOUNTER — Encounter: Payer: Self-pay | Admitting: Skilled Nursing Facility1

## 2023-03-13 VITALS — Ht 65.0 in | Wt 206.5 lb

## 2023-03-13 DIAGNOSIS — R7303 Prediabetes: Secondary | ICD-10-CM

## 2023-03-13 DIAGNOSIS — E669 Obesity, unspecified: Secondary | ICD-10-CM | POA: Diagnosis present

## 2023-03-13 DIAGNOSIS — I10 Essential (primary) hypertension: Secondary | ICD-10-CM

## 2023-03-13 MED ORDER — HYDROCHLOROTHIAZIDE 12.5 MG PO TABS: ORAL_TABLET | ORAL | 2 refills | Status: DC

## 2023-03-13 NOTE — Progress Notes (Signed)
Bariatric Nutrition Follow-Up Visit Medical Nutrition Therapy   NUTRITION ASSESSMENT  Anthropometrics  Start weight at [facility]: 279.8 lbs (date: 09/21/2021)  Weight today: 206.5 pounds  Sx date: RYGB; 03/13/2022   Clinical  Medical hx: GERD, Hypertension, prediabetes Medications: Benicar, hydrodiuril, voltaren, Singulair, inhaler, Flonase, xyzal, Prilosec; cut bp meds in half Labs: see list Notable signs/symptoms: reflux  Any previous deficiencies? Yes, vitamin D   Body Composition Scale 05/08/2022 08/09/2022 01/01/2023 03/13/2023  Current Body Weight 250.3 233 207.3 206.5  Total Body Fat % 44.7 42.8 40 39.9  Visceral Fat 15 14 12 12   Fat-Free Mass % 55.2 57.1 59.9 60   Total Body Water % 42.1 43 44.4 44.5  Muscle-Mass lbs 32.6 32.4 31.7 31.7  BMI 40.8 37.9 34.1 34  Body Fat Displacement             Torso  lbs 69.3 61.8 51.3 51         Left Leg  lbs 13.8 12.3 10.2 10.2         Right Leg  lbs 13.8 12.3 10.2 10.2         Left Arm  lbs 6.9 6.1 5.1 5.1         Right Arm   lbs 6.9 6.1 5.1 5.1     Lifestyle & Dietary Hx  Pt states she really feels great feeling 1000% better since before surgery. Pt states food choices come naturally now. Pt states she likes to swim.    Estimated daily fluid intake: 32-40 oz Estimated daily protein intake: 80 g Supplements: multi and calcium Current average weekly physical activity: elliptical 2 times a week 15-25 minutes, treadmill 15-25 minutes, light weights 2 times a week; every Monday a dance class for 30 minutes; 10 minutes on a shaker; swimming 1-2 times a week  24-Hr Dietary Recall: wakes at 5-5:30; 2 servings of fruit per day First Meal: premade boiled egg cheese and bread  Snack 10:30:  protein shake Second Meal: peanut butter crackers  Snack:  nuts + fruit  Third Meal 3-5: chicken + rice + tomato + lettuce Snack: larry and lennys protein cookies Beverages: water + flavorings, water, unsweet tea, alkaline water, sparkling water,  juice  Post-Op Goals/ Signs/ Symptoms Using straws: no Drinking while eating: no Chewing/swallowing difficulties: no Changes in vision: no Changes to mood/headaches: no Hair loss/changes to skin/nails: no Difficulty focusing/concentrating: no Sweating: no Limb weakness: no Dizziness/lightheadedness: no Palpitations: no  Carbonated/caffeinated beverages: no N/V/D/C/Gas: no Abdominal pain: no Dumping syndrome: no    NUTRITION DIAGNOSIS  Overweight/obesity ([place]-3.3) related to past poor dietary habits and physical inactivity as evidenced by completed bariatric surgery and following dietary guidelines for continued weight loss and healthy nutrition status.     NUTRITION INTERVENTION Nutrition counseling (C-1) and education (E-2) to facilitate bariatric surgery goals, including: The importance of consuming adequate calories as well as certain nutrients daily due to the body's need for essential vitamins, minerals, and fats The importance of daily physical activity and to reach a goal of at least 150 minutes of moderate to vigorous physical activity weekly (or as directed by their physician) due to benefits such as increased musculature and improved lab values The importance of intuitive eating specifically learning hunger-satiety cues and understanding the importance of learning a new body: The importance of mindful eating to avoid grazing behaviors  Encouraged patient to honor their body's internal hunger and fullness cues.  Throughout the day, check in mentally and rate hunger. Stop eating when  satisfied not full regardless of how much food is left on the plate.  Get more if still hungry 20-30 minutes later.  The key is to honor satisfaction so throughout the meal, rate fullness factor and stop when comfortably satisfied not physically full. The key is to honor hunger and fullness without any feelings of guilt or shame.  Pay attention to what the internal cues are, rather than any external  factors. This will enhance the confidence you have in listening to your own body and following those internal cues enabling you to increase how often you eat when you are hungry not out of appetite and stop when you are satisfied not full.  Encouraged pt to continue to eat balanced meals inclusive of non starchy vegetables 2 times a day 7 days a week Encouraged pt to choose lean protein sources: limiting beef, pork, sausage, hotdogs, and lunch meat Encourage pt to choose healthy fats such as plant based limiting animal fats Encouraged pt to continue to drink a minium 64 fluid ounces with half being plain water to satisfy proper hydration  Creation of balanced and diverse meals to increase the intake of nutrient-rich foods that provide essential vitamins, minerals, fiber, and phytonutrients  Variety of Fruits and Vegetables:  Aim for a colorful array of fruits and vegetables to ensure a wide range of nutrients. Include a mix of leafy greens, berries, citrus fruits, cruciferous vegetables, and more. Whole Grains: Choose whole grains over refined grains. Examples include brown rice, quinoa, oats, whole wheat, and barley. Lean Proteins: Include lean sources of protein, such as poultry, fish, tofu, legumes, beans, lentils, and low-fat dairy products. Limit red and processed meats. Healthy Fats: Incorporate sources of healthy fats, including avocados, nuts, seeds, and olive oil. Limit saturated and trans fats found in fried and processed foods. Dairy or Dairy Alternatives: Choose low-fat or fat-free dairy products, or plant-based alternatives like almond or soy milk. Portion Control: Be mindful of portion sizes to avoid overeating. Pay attention to hunger and satisfaction cues. Limit Added Sugars: Minimize the consumption of sugary beverages, snacks, and desserts. Check food labels for added sugars and opt for natural sources of sweetness such as whole fruits. Hydration: Drink plenty of water  throughout the day. Limit sugary drinks and excessive caffeine intake. Moderate Sodium Intake: Reduce the consumption of high-sodium foods. Use herbs and spices for flavor instead of excessive salt. Meal Planning and Preparation: Plan and prepare meals ahead of time to make healthier choices more convenient. Include a mix of food groups in each meal. Limit Processed Foods: Minimize the intake of highly processed and packaged foods that are often high in added sugars, salt, and unhealthy fats. Regular Physical Activity: Combine a healthy diet with regular physical activity for overall well-being. Aim for at least 150 minutes of moderate-intensity aerobic exercise per week, along with strength training. Moderation and Balance: Enjoy treats and indulgent foods in moderation, emphasizing balance rather than strict restriction.  Be sure to choose nutrient dense foods as snack instead of crackers   Handouts Previously Provided Include  1 year  Learning Style & Readiness for Change Teaching method utilized: Visual & Auditory  Demonstrated degree of understanding via: Teach Back  Readiness Level: contemplative  Barriers to learning/adherence to lifestyle change: none identified   RD's Notes for Next Visit Assess adherence to pt chosen goals    MONITORING & EVALUATION Dietary intake, weekly physical activity, body weight  Next Steps Patient is to follow-up in 6 months

## 2023-03-15 ENCOUNTER — Encounter: Payer: Self-pay | Admitting: Obstetrics & Gynecology

## 2023-03-15 ENCOUNTER — Ambulatory Visit (INDEPENDENT_AMBULATORY_CARE_PROVIDER_SITE_OTHER): Payer: BC Managed Care – PPO | Admitting: Obstetrics & Gynecology

## 2023-03-15 ENCOUNTER — Other Ambulatory Visit (HOSPITAL_COMMUNITY)
Admission: RE | Admit: 2023-03-15 | Discharge: 2023-03-15 | Disposition: A | Discharge status: Home / Self Care | Payer: BC Managed Care – PPO | Source: Ambulatory Visit | Attending: Obstetrics & Gynecology | Admitting: Obstetrics & Gynecology

## 2023-03-15 VITALS — BP 112/75 | HR 66 | Ht 65.0 in | Wt 209.0 lb

## 2023-03-15 DIAGNOSIS — Z113 Encounter for screening for infections with a predominantly sexual mode of transmission: Secondary | ICD-10-CM

## 2023-03-15 DIAGNOSIS — Z01419 Encounter for gynecological examination (general) (routine) without abnormal findings: Secondary | ICD-10-CM | POA: Insufficient documentation

## 2023-03-15 DIAGNOSIS — Z1339 Encounter for screening examination for other mental health and behavioral disorders: Secondary | ICD-10-CM

## 2023-03-15 DIAGNOSIS — N870 Mild cervical dysplasia: Secondary | ICD-10-CM | POA: Insufficient documentation

## 2023-03-15 DIAGNOSIS — Z1231 Encounter for screening mammogram for malignant neoplasm of breast: Secondary | ICD-10-CM

## 2023-03-15 NOTE — Progress Notes (Signed)
GYNECOLOGY ANNUAL PREVENTATIVE CARE ENCOUNTER NOTE  History:     Rachel Swanson is a 47 y.o. [id number] female here for a routine annual gynecologic exam.  Current complaints: none.   Denies abnormal vaginal bleeding, discharge, pelvic pain, problems with intercourse or other gynecologic concerns.    Gynecologic History No LMP recorded. Contraception: none Last Pap: 06/23/2021. Result was ASCUS with positive HRHPV, follow up colposcopy pathology showed CIN I. Last Mammogram: 09/14/2022.  Result was normal   Obstetric History OB History  Gravida Para Term Preterm AB Living  1       1    SAB IAB Ectopic Multiple Live Births  1            # Outcome Date GA Lbr Len/2nd Weight Sex Delivery Anes PTL Lv  1 SAB             Past Medical History:  Diagnosis Date   Acute midline low back pain without sciatica 12/19/2022   Chronic cough 08/25/2019   Exertional dyspnea 03/31/2022   GERD (gastroesophageal reflux disease)    Hypertension    Hypokalemia 05/17/2015   Pre-diabetes    Prediabetes    Seasonal allergies     Past Surgical History:  Procedure Laterality Date   BUNIONECTOMY Right    GASTRIC ROUX-EN-Y N/A 03/13/2022   Procedure: LAPAROSCOPIC ROUX-EN-Y GASTRIC BYPASS WITH UPPER ENDOSCOPY;  Surgeon: Gaynelle Adu, MD;  Location: [facility];  Service: General;  Laterality: N/A;   HIATAL HERNIA REPAIR N/A 03/13/2022   Procedure: LAPAROSCOPIC HERNIA REPAIR HIATAL;  Surgeon: Gaynelle Adu, MD;  Location: [facility];  Service: General;  Laterality: N/A;   WISDOM TOOTH EXTRACTION      Current Outpatient Medications on File Prior to Visit  Medication Sig Dispense Refill   diclofenac Sodium (VOLTAREN) 1 % GEL Apply 2 g topically 3 (three) times daily as needed (pain). 100 g 2   fluticasone (FLONASE) 50 MCG/ACT nasal spray PLACE 1 SPRAY INTO BOTH NOSTRILS 2 (TWO) TIMES DAILY. FOR ALLERGIES. 48 mL 0   hydrochlorothiazide (HYDRODIURIL) 12.5 MG tablet TAKE 1 TABLET BY MOUTH EVERY DAY FOR BLOOD  PRESSURE 90 tablet 2   levocetirizine (XYZAL) 5 MG tablet TAKE 1 TABLET (5 MG TOTAL) BY MOUTH EVERY EVENING. FOR COUGH/ALLERGIES 90 tablet 3   montelukast (SINGULAIR) 10 MG tablet TAKE 1 TABLET (10 MG TOTAL) BY MOUTH AT BEDTIME. FOR ALLERGIES. 90 tablet 3   olmesartan (BENICAR) 20 MG tablet Take 1 tablet (20 mg total) by mouth daily. for blood pressure. 90 tablet 3   cyclobenzaprine (FLEXERIL) 10 MG tablet Take 1/2 to one tablet Q8H prn muscle spasm (Patient not taking: Reported on 02/01/2023) 30 tablet 0   tetrahydrozoline 0.05 % ophthalmic solution Place 1 drop into both eyes daily as needed (dry/irritated eyes). (Patient not taking: Reported on 02/01/2023)     No current facility-administered medications on file prior to visit.    Allergies  Allergen Reactions   Ace Inhibitors Cough    Social History:  reports that she has never smoked. She has never used smokeless tobacco. She reports current alcohol use. She reports that she does not use drugs.  Family History  Problem Relation Age of Onset   Heart disease Mother    Hypertension Mother    Hyperlipidemia Father    Heart attack Father    Diabetes Sister    Breast cancer Paternal Aunt    Breast cancer Cousin        paternal  Colon cancer Neg Hx    Esophageal cancer Neg Hx    Rectal cancer Neg Hx    Stomach cancer Neg Hx     The following portions of the patient's history were reviewed and updated as appropriate: allergies, current medications, past family history, past medical history, past social history, past surgical history and problem list.  Review of Systems Pertinent items noted in HPI and remainder of comprehensive ROS otherwise negative.  Physical Exam:  BP 112/75   Pulse 66   Ht 5\' 5"  (1.651 m)   Wt 209 lb (94.8 kg)   BMI 34.78 kg/m  CONSTITUTIONAL: Well-developed, well-nourished female in no acute distress.  HENT:  Normocephalic, atraumatic, External right and left ear normal.  EYES: Conjunctivae and EOM are  normal. Pupils are equal, round, and reactive to light. No scleral icterus.  NECK: Normal range of motion, supple, no masses.  Normal thyroid.  SKIN: Skin is warm and dry. No rash noted. Not diaphoretic. No erythema. No pallor. MUSCULOSKELETAL: Normal range of motion. No tenderness.  No cyanosis, clubbing, or edema. NEUROLOGIC: Alert and oriented to person, place, and time. Normal reflexes, muscle tone coordination.  PSYCHIATRIC: Normal mood and affect. Normal behavior. Normal judgment and thought content. CARDIOVASCULAR: Normal heart rate noted, regular rhythm RESPIRATORY: Clear to auscultation bilaterally. Effort and breath sounds normal, no problems with respiration noted. BREASTS: Symmetric in size. No masses, tenderness, skin changes, nipple drainage, or lymphadenopathy bilaterally. Performed in the presence of a chaperone. ABDOMEN: Soft, no distention noted.  No tenderness, rebound or guarding.  PELVIC: Normal appearing external genitalia and urethral meatus; normal appearing vaginal mucosa and cervix.  No abnormal vaginal discharge noted.  Pap smear obtained.  Normal uterine size, no other palpable masses, no uterine or adnexal tenderness.  Performed in the presence of a chaperone.   Assessment and Plan:     1. Breast cancer screening by mammogram Mammogram scheduled - [facility] 3D SCREENING MAMMOGRAM BILATERAL BREAST; Future  2. Mild dysplasia of cervix (CIN I) - Cytology - PAP to be done today. If normal, repeat cotesting in one year.  3. Routine screening for STI (sexually transmitted infection) STI screen done, will follow up results and manage accordingly. - Cytology - PAP - RPR+HBsAg+HCVAb+ HIV  4. Well woman exam with routine gynecological exam - Cytology - PAP Will follow up results of pap smear and manage accordingly. Colon cancer screening is up to date Routine preventative health maintenance measures emphasized. Please refer to After Visit Summary for other counseling  recommendations.      Jaynie Collins, MD, FACOG Obstetrician & Gynecologist, [facility] for [employer], [facility]

## 2023-03-16 ENCOUNTER — Encounter: Payer: Self-pay | Admitting: Obstetrics & Gynecology

## 2023-03-16 LAB — RPR+HBSAG+HCVAB+...
HIV Screen 4th Generation wRfx: NONREACTIVE
Hep C Virus Ab: NONREACTIVE
Hepatitis B Surface Ag: NEGATIVE
RPR Ser Ql: NONREACTIVE

## 2023-03-20 LAB — CYTOLOGY - PAP
Chlamydia: NEGATIVE
Comment: NEGATIVE
Comment: NEGATIVE
Comment: NEGATIVE
Comment: NORMAL
Diagnosis: NEGATIVE
High risk HPV: NEGATIVE
Neisseria Gonorrhea: NEGATIVE
Trichomonas: NEGATIVE

## 2023-04-09 ENCOUNTER — Other Ambulatory Visit: Payer: Self-pay | Admitting: Primary Care

## 2023-04-09 DIAGNOSIS — J302 Other seasonal allergic rhinitis: Secondary | ICD-10-CM

## 2023-08-02 ENCOUNTER — Other Ambulatory Visit: Payer: Self-pay | Admitting: Primary Care

## 2023-08-02 DIAGNOSIS — J302 Other seasonal allergic rhinitis: Secondary | ICD-10-CM

## 2023-08-22 ENCOUNTER — Ambulatory Visit
Admission: RE | Admit: 2023-08-22 | Discharge: 2023-08-22 | Disposition: A | Discharge status: Home / Self Care | Payer: BC Managed Care – PPO | Source: Ambulatory Visit | Attending: Primary Care

## 2023-08-22 ENCOUNTER — Ambulatory Visit: Payer: BC Managed Care – PPO | Admitting: Primary Care

## 2023-08-22 ENCOUNTER — Encounter: Payer: Self-pay | Admitting: Primary Care

## 2023-08-22 VITALS — BP 128/82 | HR 76 | Temp 97.8°F | Ht 65.0 in | Wt 192.0 lb

## 2023-08-22 DIAGNOSIS — M545 Low back pain, unspecified: Secondary | ICD-10-CM | POA: Diagnosis not present

## 2023-08-22 DIAGNOSIS — G8929 Other chronic pain: Secondary | ICD-10-CM

## 2023-08-22 DIAGNOSIS — E041 Nontoxic single thyroid nodule: Secondary | ICD-10-CM | POA: Diagnosis not present

## 2023-08-22 MED ORDER — CYCLOBENZAPRINE HCL 5 MG PO TABS: 5.0000 mg | ORAL_TABLET | Freq: Three times a day (TID) | ORAL | 0 refills | Status: DC | PRN

## 2023-08-22 NOTE — Assessment & Plan Note (Signed)
Due for repeat thyroid ultrasound. Ordered and pending.

## 2023-08-22 NOTE — Progress Notes (Signed)
Subjective:    Patient ID: Rachel Swanson, female    DOB: 03-22-1976, 47 y.o.   MRN: [id number]  Back Pain Pertinent negatives include no numbness or weakness.    Rachel Swanson is a very pleasant 47 y.o. female with history of hypertension, obesity, prediabetes, bilateral knee pain, chronic back pain who presents today to discuss back pain.  She is also overdue for her thyroid ultrasound.  Chronic history of intermittent lower back pain for years. Her recent pain is located to the bilateral lower back which began about 2 weeks ago. She describes her pain as achy and stiff, worse in the morning when getting up, worse when putting on her clothes on, when moving a certain way, and when sleeping.   She's been taking Tylenol Arthritis, BioFreeze, and Voltaren Gel with some improvement. Historically, she's taken cyclobenzaprine which helps. She ran out of her pills and is needing refills.   She has been exercising regularly, doesn't recall over doing an exercise or pulling a muscle. She denies radiation of pain down her legs, numbness/tingling, no bowel/bladder control.  She has never completed physical therapy.   Wt Readings from Last 3 Encounters:  08/22/23 192 lb (87.1 kg)  03/15/23 209 lb (94.8 kg)  03/13/23 206 lb 8 oz (93.7 kg)   BP Readings from Last 3 Encounters:  08/22/23 128/82  03/15/23 112/75  02/01/23 98/60      Review of Systems  Musculoskeletal:  Positive for back pain.  Skin:  Negative for color change.  Neurological:  Negative for weakness and numbness.         Past Medical History:  Diagnosis Date   Acute midline low back pain without sciatica 12/19/2022   Chronic cough 08/25/2019   Exertional dyspnea 03/31/2022   GERD (gastroesophageal reflux disease)    Hypertension    Hypokalemia 05/17/2015   Pre-diabetes    Prediabetes    Seasonal allergies     Social History   Socioeconomic History   Marital status: Single    Spouse name: Not on file    Number of children: 0   Years of education: Not on file   Highest education level: Not on file  Occupational History   Occupation: Teacher  Tobacco Use   Smoking status: Never   Smokeless tobacco: Never  Vaping Use   Vaping status: Never Used  Substance and Sexual Activity   Alcohol use: Yes    Comment: 1-2 per month   Drug use: No   Sexual activity: Not Currently    Partners: Male    Birth control/protection: Condom  Other Topics Concern   Not on file  Social History Narrative   Single.   Teacher at [employer].   Enjoys exercising, visiting with her niece.    Social Determinants of Health   Financial Resource Strain: Not on file  Food Insecurity: Not on file  Transportation Needs: Not on file  Physical Activity: Not on file  Stress: Not on file  Social Connections: Not on file  Intimate Partner Violence: Not on file    Past Surgical History:  Procedure Laterality Date   BUNIONECTOMY Right    GASTRIC ROUX-EN-Y N/A 03/13/2022   Procedure: LAPAROSCOPIC ROUX-EN-Y GASTRIC BYPASS WITH UPPER ENDOSCOPY;  Surgeon: Gaynelle Adu, MD;  Location: [facility];  Service: General;  Laterality: N/A;   HIATAL HERNIA REPAIR N/A 03/13/2022   Procedure: LAPAROSCOPIC HERNIA REPAIR HIATAL;  Surgeon: Gaynelle Adu, MD;  Location: [facility];  Service: General;  Laterality:  N/A;   WISDOM TOOTH EXTRACTION      Family History  Problem Relation Age of Onset   Heart disease Mother    Hypertension Mother    Hyperlipidemia Father    Heart attack Father    Diabetes Sister    Breast cancer Paternal Aunt    Breast cancer Cousin        paternal   Colon cancer Neg Hx    Esophageal cancer Neg Hx    Rectal cancer Neg Hx    Stomach cancer Neg Hx     Allergies  Allergen Reactions   Ace Inhibitors Cough    Current Outpatient Medications on File Prior to Visit  Medication Sig Dispense Refill   diclofenac Sodium (VOLTAREN) 1 % GEL Apply 2 g topically 3 (three) times daily as needed (pain). 100 g 2    fluticasone (FLONASE) 50 MCG/ACT nasal spray PLACE 1 SPRAY INTO BOTH NOSTRILS 2 (TWO) TIMES DAILY. FOR ALLERGIES. 48 mL 0   hydrochlorothiazide (HYDRODIURIL) 12.5 MG tablet TAKE 1 TABLET BY MOUTH EVERY DAY FOR BLOOD PRESSURE 90 tablet 2   levocetirizine (XYZAL) 5 MG tablet TAKE 1 TABLET (5 MG TOTAL) BY MOUTH EVERY EVENING. FOR COUGH/ALLERGIES 90 tablet 3   montelukast (SINGULAIR) 10 MG tablet TAKE 1 TABLET (10 MG TOTAL) BY MOUTH AT BEDTIME. FOR ALLERGIES. 90 tablet 3   olmesartan (BENICAR) 20 MG tablet Take 1 tablet (20 mg total) by mouth daily. for blood pressure. 90 tablet 3   tetrahydrozoline 0.05 % ophthalmic solution Place 1 drop into both eyes daily as needed (dry/irritated eyes).     No current facility-administered medications on file prior to visit.    BP 128/82   Pulse 76   Temp 97.8 F (36.6 C) (Temporal)   Ht 5\' 5"  (1.651 m)   Wt 192 lb (87.1 kg)   SpO2 99%   BMI 31.95 kg/m  Objective:   Physical Exam Cardiovascular:     Rate and Rhythm: Normal rate and regular rhythm.  Musculoskeletal:     Lumbar back: No tenderness or bony tenderness. Decreased range of motion. Negative right straight leg raise test and negative left straight leg raise test.       Back:  Neurological:     Mental Status: She is alert.           Assessment & Plan:  Chronic bilateral low back pain without sciatica Assessment & Plan: Symptoms and presentation today representative of lumbar strain, could be arthritis. No alarm signs on exam or during HPI.  Plain films of the lumbar spine ordered and pending. Refills provided for cyclobenzaprine 5 mg tablets.  Drowsiness precautions provided.  Recommended physical therapy for which she declines but will think about. Commended her on weight loss!  Orders: -     DG Lumbar Spine Complete -     Cyclobenzaprine HCl; Take 1 tablet (5 mg total) by mouth 3 (three) times daily as needed for muscle spasms.  Dispense: 30 tablet; Refill: 0  Thyroid  nodule Assessment & Plan: Due for repeat thyroid ultrasound. Ordered and pending.  Orders: -     US THYROID; Future        Doreene Nest, NP

## 2023-08-22 NOTE — Patient Instructions (Signed)
Complete xray(s) prior to leaving today. I will notify you of your results once received.  You may take the muscle relaxer medication (cyclobenzaprine) every 8 hours as needed.  This may cause drowsiness.  Please consider physical therapy.  It was a pleasure to see you today!

## 2023-08-22 NOTE — Assessment & Plan Note (Signed)
Symptoms and presentation today representative of lumbar strain, could be arthritis. No alarm signs on exam or during HPI.  Plain films of the lumbar spine ordered and pending. Refills provided for cyclobenzaprine 5 mg tablets.  Drowsiness precautions provided.  Recommended physical therapy for which she declines but will think about. Commended her on weight loss!

## 2023-08-31 ENCOUNTER — Encounter: Payer: Self-pay | Admitting: *Deleted

## 2023-09-17 ENCOUNTER — Ambulatory Visit: Payer: BC Managed Care – PPO | Admitting: Skilled Nursing Facility1

## 2023-09-17 ENCOUNTER — Ambulatory Visit: Payer: BC Managed Care – PPO

## 2023-10-04 ENCOUNTER — Ambulatory Visit: Payer: BC Managed Care – PPO

## 2023-10-08 ENCOUNTER — Ambulatory Visit
Admission: RE | Admit: 2023-10-08 | Discharge: 2023-10-08 | Disposition: A | Discharge status: Home / Self Care | Payer: Managed Care, Other (non HMO) | Source: Ambulatory Visit | Attending: Obstetrics & Gynecology | Admitting: Obstetrics & Gynecology

## 2023-10-08 DIAGNOSIS — Z1231 Encounter for screening mammogram for malignant neoplasm of breast: Secondary | ICD-10-CM

## 2023-10-09 ENCOUNTER — Encounter: Payer: Self-pay | Admitting: Obstetrics & Gynecology

## 2023-10-17 ENCOUNTER — Encounter (HOSPITAL_COMMUNITY): Payer: Self-pay | Admitting: *Deleted

## 2023-11-02 ENCOUNTER — Other Ambulatory Visit: Payer: Self-pay | Admitting: Primary Care

## 2023-11-02 DIAGNOSIS — J302 Other seasonal allergic rhinitis: Secondary | ICD-10-CM

## 2023-11-02 NOTE — Telephone Encounter (Signed)
 Spoke to pt, scheduled cpe for 02/05/24

## 2023-11-02 NOTE — Telephone Encounter (Signed)
 Patient is due for CPE/follow up in mid May, this will be required prior to any further refills.  Please schedule, thank you!

## 2023-11-05 ENCOUNTER — Other Ambulatory Visit: Payer: Self-pay | Admitting: Primary Care

## 2023-11-05 DIAGNOSIS — J3089 Other allergic rhinitis: Secondary | ICD-10-CM

## 2023-11-05 DIAGNOSIS — I10 Essential (primary) hypertension: Secondary | ICD-10-CM

## 2023-11-14 ENCOUNTER — Ambulatory Visit
Admission: RE | Admit: 2023-11-14 | Discharge: 2023-11-14 | Disposition: A | Discharge status: Home / Self Care | Payer: 59 | Source: Ambulatory Visit | Attending: Primary Care | Admitting: Primary Care

## 2023-11-14 DIAGNOSIS — E041 Nontoxic single thyroid nodule: Secondary | ICD-10-CM | POA: Diagnosis present

## 2023-12-02 ENCOUNTER — Other Ambulatory Visit: Payer: Self-pay | Admitting: Primary Care

## 2023-12-02 DIAGNOSIS — I10 Essential (primary) hypertension: Secondary | ICD-10-CM

## 2024-01-31 ENCOUNTER — Other Ambulatory Visit: Payer: Self-pay | Admitting: Primary Care

## 2024-01-31 DIAGNOSIS — J302 Other seasonal allergic rhinitis: Secondary | ICD-10-CM

## 2024-02-05 ENCOUNTER — Ambulatory Visit (INDEPENDENT_AMBULATORY_CARE_PROVIDER_SITE_OTHER): Payer: Managed Care, Other (non HMO) | Admitting: Primary Care

## 2024-02-05 ENCOUNTER — Encounter: Payer: Self-pay | Admitting: Primary Care

## 2024-02-05 ENCOUNTER — Ambulatory Visit: Payer: Self-pay | Admitting: Primary Care

## 2024-02-05 VITALS — BP 118/62 | HR 88 | Temp 96.6°F | Ht 65.0 in | Wt 205.0 lb

## 2024-02-05 DIAGNOSIS — Z0001 Encounter for general adult medical examination with abnormal findings: Secondary | ICD-10-CM | POA: Diagnosis not present

## 2024-02-05 DIAGNOSIS — Z9884 Bariatric surgery status: Secondary | ICD-10-CM

## 2024-02-05 DIAGNOSIS — R7303 Prediabetes: Secondary | ICD-10-CM

## 2024-02-05 DIAGNOSIS — I10 Essential (primary) hypertension: Secondary | ICD-10-CM

## 2024-02-05 DIAGNOSIS — E041 Nontoxic single thyroid nodule: Secondary | ICD-10-CM

## 2024-02-05 DIAGNOSIS — F411 Generalized anxiety disorder: Secondary | ICD-10-CM

## 2024-02-05 DIAGNOSIS — Z23 Encounter for immunization: Secondary | ICD-10-CM | POA: Diagnosis not present

## 2024-02-05 DIAGNOSIS — J302 Other seasonal allergic rhinitis: Secondary | ICD-10-CM

## 2024-02-05 DIAGNOSIS — E559 Vitamin D deficiency, unspecified: Secondary | ICD-10-CM | POA: Diagnosis not present

## 2024-02-05 DIAGNOSIS — K219 Gastro-esophageal reflux disease without esophagitis: Secondary | ICD-10-CM | POA: Diagnosis not present

## 2024-02-05 LAB — VITAMIN D 25 HYDROXY (VIT D DEFICIENCY, FRACTURES): VITD: 41.04 ng/mL (ref 30.00–100.00)

## 2024-02-05 LAB — CBC
HCT: 40.8 % (ref 36.0–46.0)
Hemoglobin: 13.5 g/dL (ref 12.0–15.0)
MCHC: 33.1 g/dL (ref 30.0–36.0)
MCV: 92 fl (ref 78.0–100.0)
Platelets: 299 10*3/uL (ref 150.0–400.0)
RBC: 4.43 Mil/uL (ref 3.87–5.11)
RDW: 13.5 % (ref 11.5–15.5)
WBC: 4 10*3/uL (ref 4.0–10.5)

## 2024-02-05 LAB — COMPREHENSIVE METABOLIC PANEL WITH GFR
ALT: 16 U/L (ref 0–35)
AST: 17 U/L (ref 0–37)
Albumin: 3.9 g/dL (ref 3.5–5.2)
Alkaline Phosphatase: 73 U/L (ref 39–117)
BUN: 13 mg/dL (ref 6–23)
CO2: 31 meq/L (ref 19–32)
Calcium: 9.4 mg/dL (ref 8.4–10.5)
Chloride: 102 meq/L (ref 96–112)
Creatinine, Ser: 1.04 mg/dL (ref 0.40–1.20)
GFR: 62.75 mL/min (ref >60.00)
Glucose, Bld: 79 mg/dL (ref 70–99)
Potassium: 3.7 meq/L (ref 3.5–5.1)
Sodium: 140 meq/L (ref 135–145)
Total Bilirubin: 0.6 mg/dL (ref 0.2–1.2)
Total Protein: 7 g/dL (ref 6.0–8.3)

## 2024-02-05 LAB — LIPID PANEL
Cholesterol: 181 mg/dL (ref 0–200)
HDL: 61.5 mg/dL (ref >39.00)
LDL Cholesterol: 92 mg/dL (ref 0–99)
NonHDL: 119.17
Total CHOL/HDL Ratio: 3
Triglycerides: 135 mg/dL (ref 0.0–149.0)
VLDL: 27 mg/dL (ref 0.0–40.0)

## 2024-02-05 LAB — IBC + FERRITIN
Ferritin: 62.4 ng/mL (ref 10.0–291.0)
Iron: 122 ug/dL (ref 42–145)
Saturation Ratios: 37.2 % (ref 20.0–50.0)
TIBC: 327.6 ug/dL (ref 250.0–450.0)
Transferrin: 234 mg/dL (ref 212.0–360.0)

## 2024-02-05 LAB — VITAMIN B12: Vitamin B-12: 1237 pg/mL — ABNORMAL HIGH (ref 211–911)

## 2024-02-05 LAB — HEMOGLOBIN A1C: Hgb A1c MFr Bld: 6 % (ref 4.6–6.5)

## 2024-02-05 MED ORDER — ESCITALOPRAM OXALATE 10 MG PO TABS: 10.0000 mg | ORAL_TABLET | Freq: Every day | ORAL | 0 refills | Status: DC

## 2024-02-05 NOTE — Assessment & Plan Note (Signed)
 Controlled.  Continue hydrochlorothiazide  12.5 mg and olmesartan  20 mg daily. CMP pending.

## 2024-02-05 NOTE — Progress Notes (Signed)
 Subjective:    Patient ID: Rachel Swanson, female    DOB: 1976-01-01, 48 y.o.   MRN: [id number]  HPI  Rachel Swanson is a very pleasant 48 y.o. female who presents today for complete physical and follow up of chronic conditions.  She would also like to discuss anxiety. Chronic for years, worse over the last year. Symptoms include feeling anxious, anxiety in social situations, feeling overwhelmed with work, irritability. Symptoms occur several times weekly. She will be resigning from her job soon so that she can go up to [place]  to take care of her deceased mother's estate. She's been taking magnesium and ashwaganda with little improvement. She is following with therapy once weekly to deal with stressors from her childhood. She has never taken medication to treat anxiety.    Immunizations: -Tetanus: Completed in 2016  -Shingles: Never completed    Diet: Fair diet.  Exercise: Regular exercise.  Eye exam: Completes annually  Dental exam: Completes semi-annually    Pap Smear: Completed in 2024 Mammogram: Completed in January 2025  Colonoscopy: Completed in 2021, due 2028  Wt Readings from Last 3 Encounters:  02/05/24 205 lb (93 kg)  08/22/23 192 lb (87.1 kg)  03/15/23 209 lb (94.8 kg)   BP Readings from Last 3 Encounters:  02/05/24 118/62  08/22/23 128/82  03/15/23 112/75       Review of Systems  Constitutional:  Negative for unexpected weight change.  HENT:  Negative for rhinorrhea.   Respiratory:  Negative for cough and shortness of breath.   Cardiovascular:  Negative for chest pain.  Gastrointestinal:  Negative for constipation and diarrhea.  Genitourinary:  Negative for difficulty urinating.  Musculoskeletal:  Negative for arthralgias and myalgias.  Skin:  Negative for rash.  Allergic/Immunologic: Negative for environmental allergies.  Neurological:  Negative for dizziness and headaches.  Psychiatric/Behavioral:  The patient is nervous/anxious.        See HPI          Past Medical History:  Diagnosis Date   Acute midline low back pain without sciatica 12/19/2022   Chronic cough 08/25/2019   Exertional dyspnea 03/31/2022   GERD (gastroesophageal reflux disease)    Hypertension    Hypokalemia 05/17/2015   Pre-diabetes    Prediabetes    Seasonal allergies     Social History   Socioeconomic History   Marital status: Single    Spouse name: Not on file   Number of children: 0   Years of education: Not on file   Highest education level: Not on file  Occupational History   Occupation: Teacher  Tobacco Use   Smoking status: Never   Smokeless tobacco: Never  Vaping Use   Vaping status: Never Used  Substance and Sexual Activity   Alcohol use: Yes    Comment: 1-2 per month   Drug use: No   Sexual activity: Not Currently    Partners: Male    Birth control/protection: Condom  Other Topics Concern   Not on file  Social History Narrative   Single.   Teacher at [employer].   Enjoys exercising, visiting with her niece.    Social Drivers of Corporate investment banker Strain: Not on file  Food Insecurity: Not on file  Transportation Needs: Not on file  Physical Activity: Not on file  Stress: Not on file  Social Connections: Not on file  Intimate Partner Violence: Not on file    Past Surgical History:  Procedure Laterality Date  BUNIONECTOMY Right    GASTRIC ROUX-EN-Y N/A 03/13/2022   Procedure: LAPAROSCOPIC ROUX-EN-Y GASTRIC BYPASS WITH UPPER ENDOSCOPY;  Surgeon: Aldean Hummingbird, MD;  Location: [facility];  Service: General;  Laterality: N/A;   HIATAL HERNIA REPAIR N/A 03/13/2022   Procedure: LAPAROSCOPIC HERNIA REPAIR HIATAL;  Surgeon: Aldean Hummingbird, MD;  Location: [facility];  Service: General;  Laterality: N/A;   WISDOM TOOTH EXTRACTION      Family History  Problem Relation Age of Onset   Heart disease Mother    Hypertension Mother    Hyperlipidemia Father    Heart attack Father    Diabetes Sister    Breast cancer Paternal  Aunt    Breast cancer Cousin 54   Colon cancer Neg Hx    Esophageal cancer Neg Hx    Rectal cancer Neg Hx    Stomach cancer Neg Hx     Allergies  Allergen Reactions   Ace Inhibitors Cough    Current Outpatient Medications on File Prior to Visit  Medication Sig Dispense Refill   diclofenac  Sodium (VOLTAREN ) 1 % GEL Apply 2 g topically 3 (three) times daily as needed (pain). 100 g 2   fluticasone  (FLONASE ) 50 MCG/ACT nasal spray PLACE 1 SPRAY INTO BOTH NOSTRILS 2 (TWO) TIMES DAILY. FOR ALLERGIES. 48 mL 0   hydrochlorothiazide  (HYDRODIURIL ) 12.5 MG tablet TAKE 1 TABLET BY MOUTH EVERY DAY FOR BLOOD PRESSURE 90 tablet 0   levocetirizine (XYZAL ) 5 MG tablet TAKE 1 TABLET (5 MG TOTAL) BY MOUTH EVERY EVENING. FOR COUGH/ALLERGIES 90 tablet 3   montelukast  (SINGULAIR ) 10 MG tablet TAKE 1 TABLET (10 MG TOTAL) BY MOUTH AT BEDTIME. FOR ALLERGIES. 90 tablet 3   olmesartan  (BENICAR ) 20 MG tablet Take 1 tablet (20 mg total) by mouth daily. for blood pressure. 90 tablet 3   tetrahydrozoline 0.05 % ophthalmic solution Place 1 drop into both eyes daily as needed (dry/irritated eyes). (Patient not taking: Reported on 02/05/2024)     No current facility-administered medications on file prior to visit.    BP 118/62   Pulse 88   Temp (!) 96.6 F (35.9 C) (Temporal)   Ht 5\' 5"  (1.651 m)   Wt 205 lb (93 kg)   LMP 01/13/2024 (Exact Date)   SpO2 95%   BMI 34.11 kg/m  Objective:   Physical Exam HENT:     Right Ear: Tympanic membrane and ear canal normal.     Left Ear: Tympanic membrane and ear canal normal.  Eyes:     Pupils: Pupils are equal, round, and reactive to light.  Cardiovascular:     Rate and Rhythm: Normal rate and regular rhythm.  Pulmonary:     Effort: Pulmonary effort is normal.     Breath sounds: Normal breath sounds.  Abdominal:     General: Bowel sounds are normal.     Palpations: Abdomen is soft.     Tenderness: There is no abdominal tenderness.  Musculoskeletal:         General: Normal range of motion.     Cervical back: Neck supple.  Skin:    General: Skin is warm and dry.  Neurological:     Mental Status: She is alert and oriented to person, place, and time.     Cranial Nerves: No cranial nerve deficit.     Deep Tendon Reflexes:     Reflex Scores:      Patellar reflexes are 2+ on the right side and 2+ on the left side. Psychiatric:  Mood and Affect: Mood normal.           Assessment & Plan:  Encounter for annual general medical examination with abnormal findings in adult Assessment & Plan: First shingles provided today. Discussed due date for second dose.  Pap smear UTD. Mammogram UTD Colonoscopy UTD, due 2028  Discussed the importance of a healthy diet and regular exercise in order for weight loss, and to reduce the risk of further co-morbidity.  Exam stable. Labs pending.  Follow up in 1 year for repeat physical.    Essential hypertension Assessment & Plan: Controlled.  Continue hydrochlorothiazide  12.5 mg and olmesartan  20 mg daily. CMP pending.  Orders: -     Comprehensive metabolic panel with GFR -     CBC  Gastroesophageal reflux disease without esophagitis Assessment & Plan: Controlled.  Continue to monitor.    Vitamin D  deficiency Assessment & Plan: Continue bariatric fusion vitamin.  Repeat vitamin D  pending.  Orders: -     VITAMIN D  25 Hydroxy (Vit-D Deficiency, Fractures)  Seasonal allergies Assessment & Plan: Stable.  Continue Singulair  10 mg daily and Xyzal  5 mg daily.    S/P gastric bypass Assessment & Plan: Following with bariatric surgery, office notes reviewed from April 2025.   Checking labs today.  She will forward results to bariatric surgeon.   Orders: -     Comprehensive metabolic panel with GFR -     IBC + Ferritin -     Lipid panel -     CBC -     Hemoglobin A1c -     Vitamin B12 -     Vitamin B1  Thyroid  nodule Assessment & Plan: Reviewed thyroid  US  from March  2025, no further imaging needed based on report.    Prediabetes Assessment & Plan: Repeat A1C pending.  Commended her on regular exercise and improve diet!  Orders: -     Hemoglobin A1c  GAD (generalized anxiety disorder) Assessment & Plan: Uncontrolled.  Discussed options for treatment.  Continue with weekly therapy sessions. Start Lexapro 10 mg daily.  Patient is to take 1/2 tablet daily for 8 days, then advance to 1 full tablet thereafter. We discussed possible side effects of headache, GI upset, drowsiness  Patient verbalized understanding.   Follow up in 4-6 weeks for re-evaluation.    Orders: -     Escitalopram Oxalate; Take 1 tablet (10 mg total) by mouth daily. for anxiety  Dispense: 90 tablet; Refill: 0        Gabriel John, NP

## 2024-02-05 NOTE — Assessment & Plan Note (Signed)
 First shingles provided today. Discussed due date for second dose.  Pap smear UTD. Mammogram UTD Colonoscopy UTD, due 2028  Discussed the importance of a healthy diet and regular exercise in order for weight loss, and to reduce the risk of further co-morbidity.  Exam stable. Labs pending.  Follow up in 1 year for repeat physical.

## 2024-02-05 NOTE — Assessment & Plan Note (Signed)
 Uncontrolled.  Discussed options for treatment.  Continue with weekly therapy sessions. Start Lexapro 10 mg daily.  Patient is to take 1/2 tablet daily for 8 days, then advance to 1 full tablet thereafter. We discussed possible side effects of headache, GI upset, drowsiness  Patient verbalized understanding.   Follow up in 4-6 weeks for re-evaluation.

## 2024-02-05 NOTE — Patient Instructions (Signed)
 Start Lexapro for anxiety and depression. Take 1/2 tablet by mouth once daily for about one week, then increase to 1 full tablet thereafter.   Stop by the lab prior to leaving today. I will notify you of your results once received.   Set up a nurse visit for 2-6 months from now to get the second Shingrix vaccine.  Please schedule a follow up visit for 4-6 weeks for follow up of anxiety/depression. This can be virtual.  It was a pleasure to see you today!

## 2024-02-05 NOTE — Assessment & Plan Note (Signed)
 Continue bariatric fusion vitamin.  Repeat vitamin D  pending.

## 2024-02-05 NOTE — Assessment & Plan Note (Signed)
 Repeat A1C pending.  Commended her on regular exercise and improve diet!

## 2024-02-05 NOTE — Assessment & Plan Note (Signed)
 Controlled.  Continue to monitor.

## 2024-02-05 NOTE — Assessment & Plan Note (Signed)
 Following with bariatric surgery, office notes reviewed from April 2025.   Checking labs today.  She will forward results to bariatric surgeon.

## 2024-02-05 NOTE — Assessment & Plan Note (Signed)
 Reviewed thyroid  US  from March 2025, no further imaging needed based on report.

## 2024-02-05 NOTE — Assessment & Plan Note (Signed)
 Stable.  Continue Singulair  10 mg daily and Xyzal  5 mg daily.

## 2024-02-08 LAB — VITAMIN B1: Vitamin B1 (Thiamine): 18 nmol/L (ref 8–30)

## 2024-02-11 NOTE — Telephone Encounter (Signed)
 I have forwarded labs to Dr. Elvan Hamel.

## 2024-02-13 DIAGNOSIS — I10 Essential (primary) hypertension: Secondary | ICD-10-CM

## 2024-02-13 MED ORDER — OLMESARTAN MEDOXOMIL 20 MG PO TABS: 20.0000 mg | ORAL_TABLET | Freq: Every day | ORAL | 3 refills | Status: DC

## 2024-02-13 NOTE — Telephone Encounter (Signed)
 Kelli, can we forward the vitamin B1 lab to Dr. Elvan Hamel?

## 2024-02-13 NOTE — Telephone Encounter (Signed)
 B1 lab routed to Dr. Elvan Hamel.

## 2024-02-22 IMAGING — RF DG UGI W SINGLE CM
11 of 13 series · 14 of 24 positions shown · non-contrast
Comparison: None.

CLINICAL DATA: Preop for bariatric surgery.

EXAM:
UPPER GI SERIES WITH KUB
TECHNIQUE: After obtaining a scout radiograph a routine upper GI series was
performed using thin density barium.
FLUOROSCOPY:
Fluoroscopy Time:  2 minutes and 42 seconds
Radiation Exposure Index (if provided by the fluoroscopic device):
43 mGy
Number of Acquired Spot Images: 0

[Series 3: t abdomen · 1 of 1 slices shown]
[im 1/1]
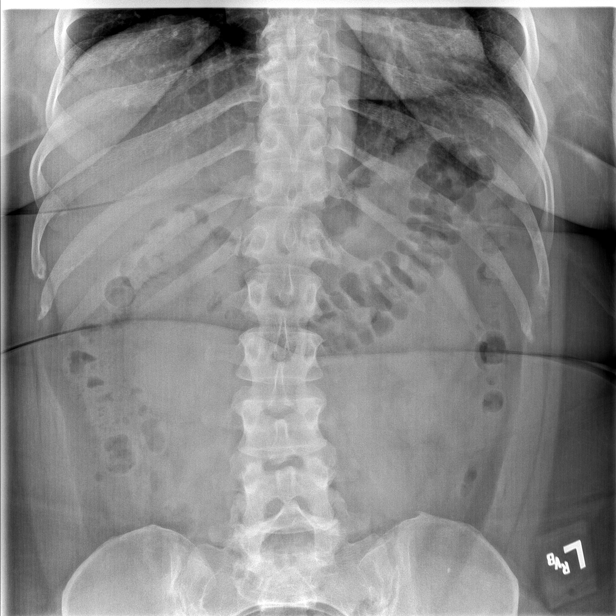

[Series 4: cp_standard · 1 of 64 frames shown (1 of 10)]
[frame 33/64]
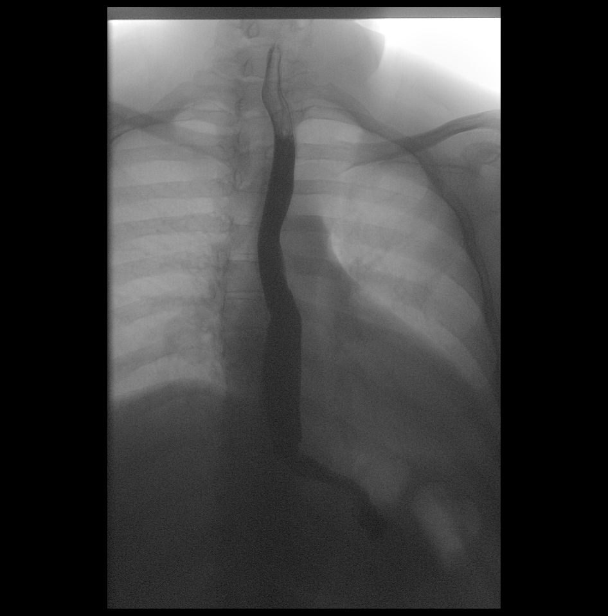

[Series 5: cp_standard · 2 of 76 frames shown (2 of 10)]
[frame 16/76]
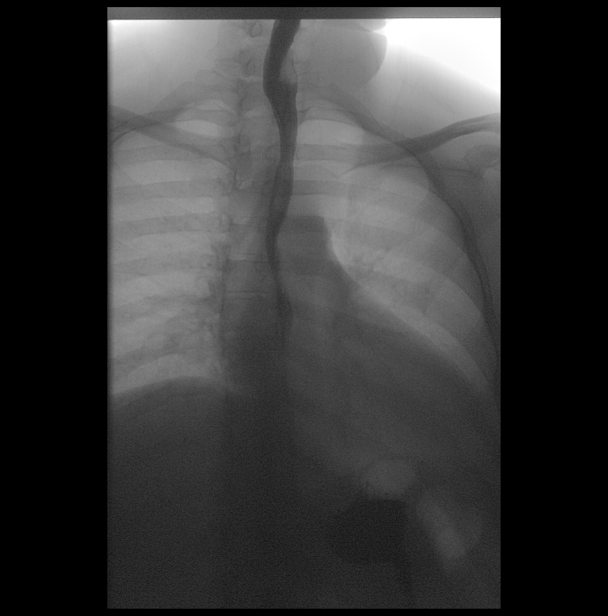
[frame 65/76]
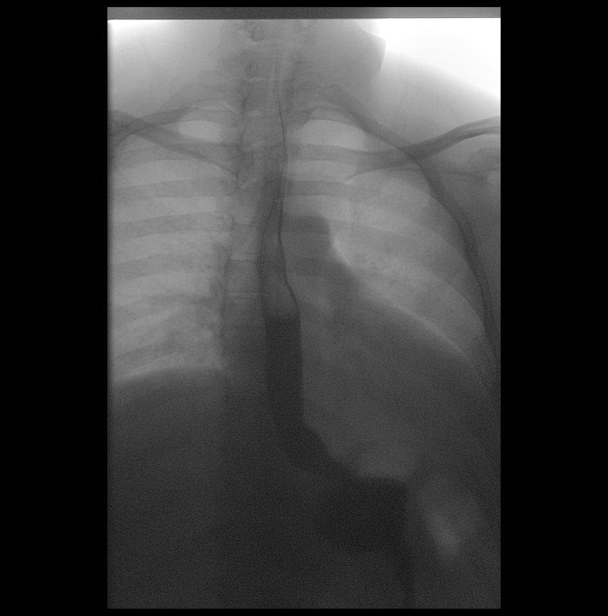

[Series 6: cp_standard · 1 of 47 frames shown (3 of 10)]
[frame 8/47]
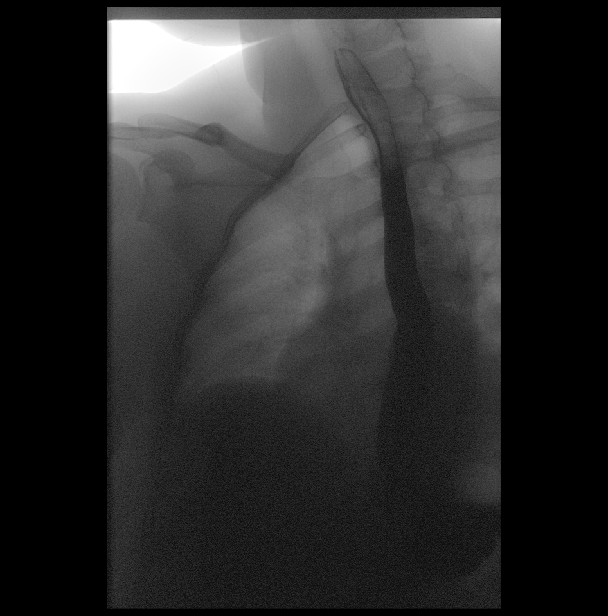

[Series 7: cp_standard · 2 of 202 frames shown (4 of 10)]
[frame 31/202]
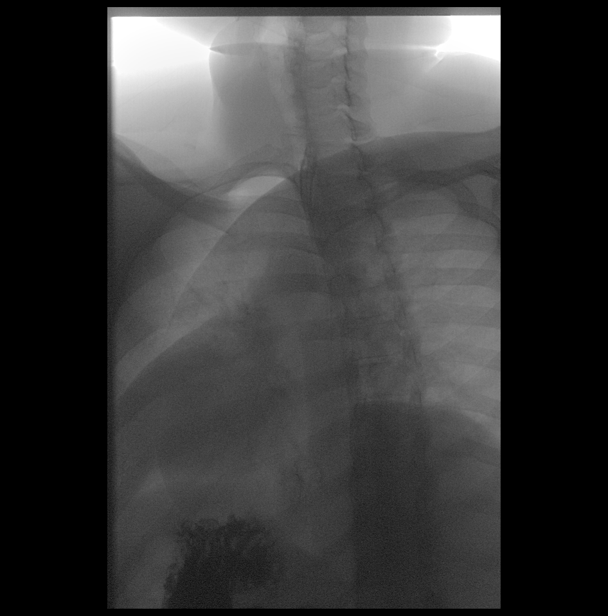
[frame 172/202]
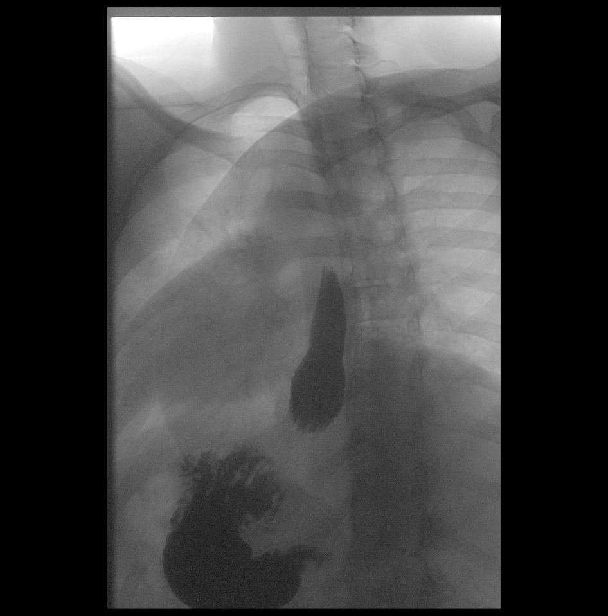

[Series 8: cp_standard · 2 of 34 frames shown (5 of 10)]
[frame 6/34]
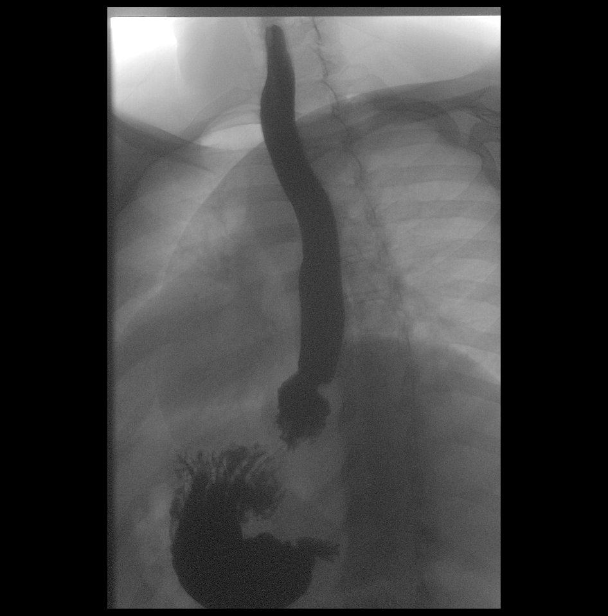
[frame 29/34]
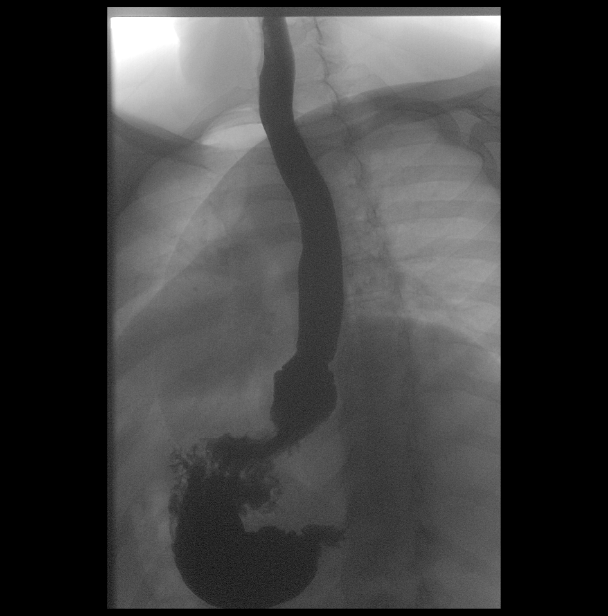

[Series 9: cp_standard · 1 of 52 frames shown (6 of 10)]
[frame 27/52]
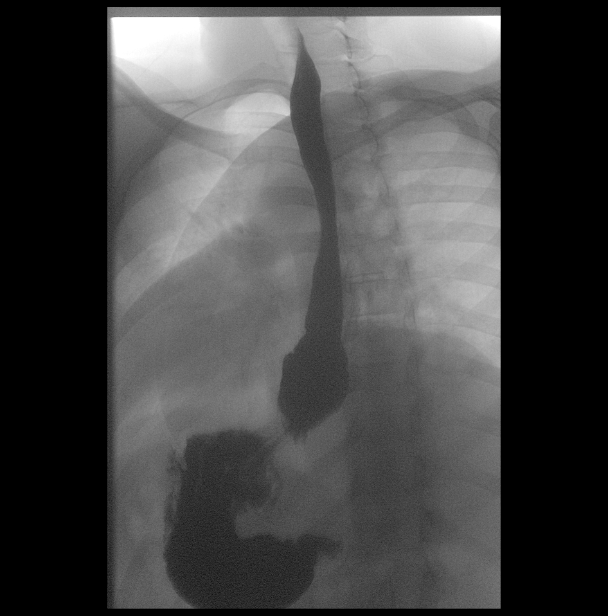

[Series 10: cp_standard · 1 of 1 slices shown (7 of 10)]
[im 1/1]
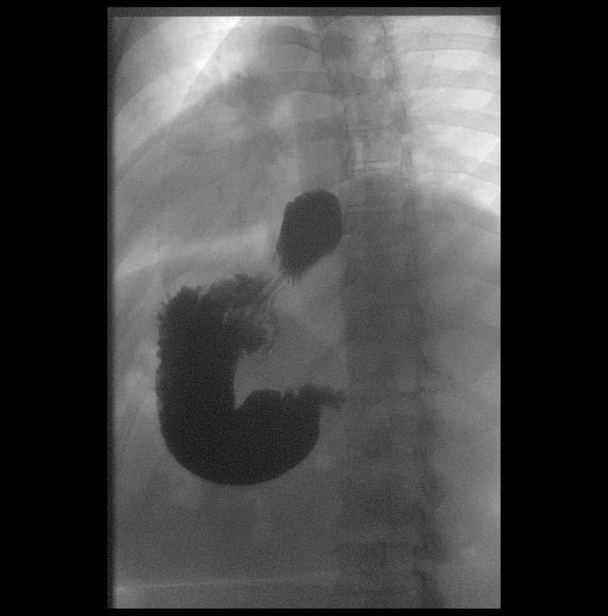

[Series 11: cp_standard · 1 of 1 slices shown (8 of 10)]
[im 1/1]
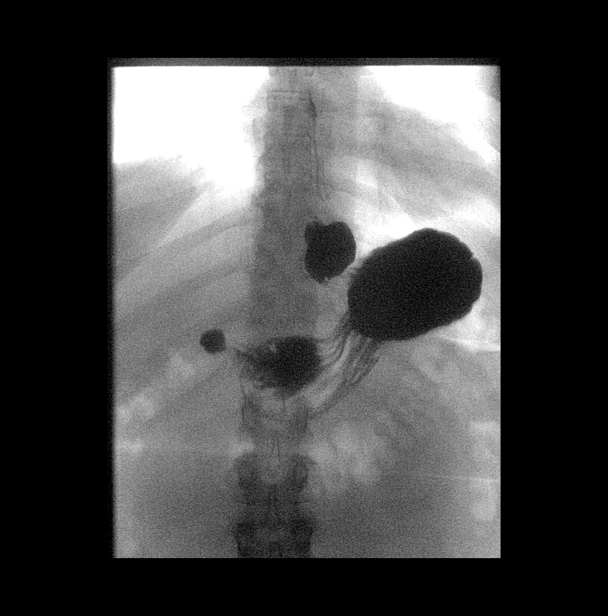

[Series 14: cp_standard · 1 of 1 slices shown (9 of 10)]
[im 1/1]
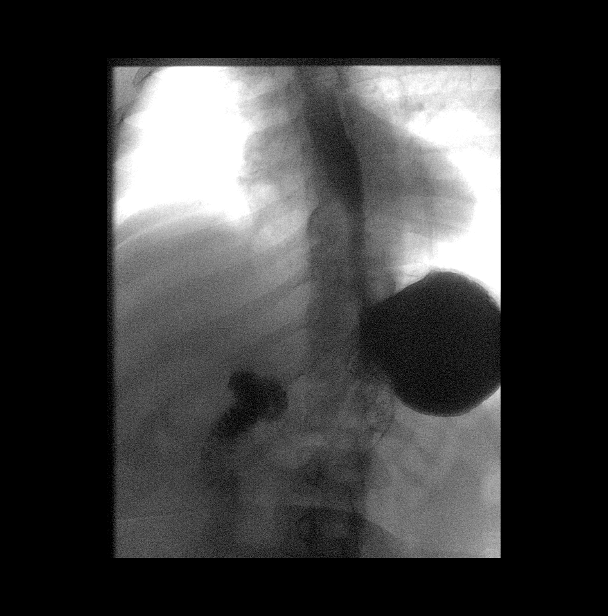

[Series 17: cp_standard · 1 of 1 slices shown (10 of 10)]
[im 1/1]
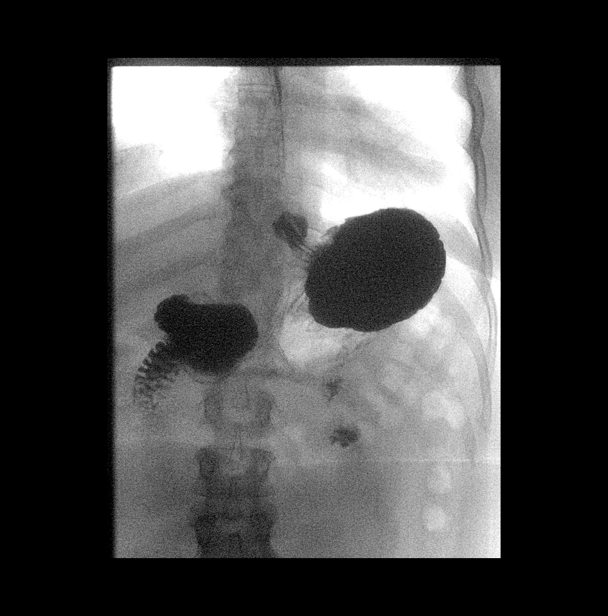

[14 of 24 positions shown; findings below may reference images not displayed]

FINDINGS: Normal esophageal motility. No esophageal mass or stricture. There
is a moderate-sized sliding-type hiatal hernia and a widely patent
lower esophageal mucosal ring. Associated inducible GE reflux with
water swallowing.

The stomach demonstrates normal mucosal fold pattern. No mass or
ulcer. The duodenal bulb and C-loop are normal. The duodenal jejunal
junction is in its normal anatomic location.
IMPRESSION: 1. Normal esophageal motility.
2. Moderate-sized sliding-type hiatal hernia with associated
inducible GE reflux.
3. Normal appearance of the stomach and duodenum.

## 2024-02-27 ENCOUNTER — Other Ambulatory Visit: Payer: Self-pay | Admitting: Primary Care

## 2024-02-27 DIAGNOSIS — I10 Essential (primary) hypertension: Secondary | ICD-10-CM

## 2024-04-07 DIAGNOSIS — J3089 Other allergic rhinitis: Secondary | ICD-10-CM

## 2024-04-07 MED ORDER — LEVOCETIRIZINE DIHYDROCHLORIDE 5 MG PO TABS
5.0000 mg | ORAL_TABLET | Freq: Every evening | ORAL | 1 refills | Status: DC
Start: 2024-04-07 — End: 2024-06-08

## 2024-04-09 ENCOUNTER — Other Ambulatory Visit: Payer: Self-pay | Admitting: Primary Care

## 2024-04-09 DIAGNOSIS — F411 Generalized anxiety disorder: Secondary | ICD-10-CM

## 2024-04-09 DIAGNOSIS — J3089 Other allergic rhinitis: Secondary | ICD-10-CM

## 2024-04-09 DIAGNOSIS — I10 Essential (primary) hypertension: Secondary | ICD-10-CM

## 2024-04-10 MED ORDER — ESCITALOPRAM OXALATE 10 MG PO TABS
10.0000 mg | ORAL_TABLET | Freq: Every day | ORAL | 5 refills | Status: AC
Start: 2024-04-10 — End: ?

## 2024-04-10 MED ORDER — HYDROCHLOROTHIAZIDE 12.5 MG PO TABS: ORAL_TABLET | ORAL | 5 refills | Status: DC

## 2024-04-10 MED ORDER — OLMESARTAN MEDOXOMIL 20 MG PO TABS: 20.0000 mg | ORAL_TABLET | Freq: Every day | ORAL | 5 refills | Status: AC

## 2024-04-10 MED ORDER — MONTELUKAST SODIUM 10 MG PO TABS: 10.0000 mg | ORAL_TABLET | Freq: Every day | ORAL | 5 refills | Status: AC

## 2024-04-22 ENCOUNTER — Ambulatory Visit (INDEPENDENT_AMBULATORY_CARE_PROVIDER_SITE_OTHER): Admitting: Obstetrics & Gynecology

## 2024-04-22 ENCOUNTER — Encounter: Payer: Self-pay | Admitting: Obstetrics & Gynecology

## 2024-04-22 ENCOUNTER — Other Ambulatory Visit (HOSPITAL_COMMUNITY)
Admission: RE | Admit: 2024-04-22 | Discharge: 2024-04-22 | Disposition: A | Discharge status: Home / Self Care | Source: Ambulatory Visit | Attending: Obstetrics & Gynecology | Admitting: Obstetrics & Gynecology

## 2024-04-22 VITALS — BP 103/69 | HR 65 | Ht 64.0 in | Wt 206.0 lb

## 2024-04-22 DIAGNOSIS — Z113 Encounter for screening for infections with a predominantly sexual mode of transmission: Secondary | ICD-10-CM

## 2024-04-22 DIAGNOSIS — Z01419 Encounter for gynecological examination (general) (routine) without abnormal findings: Secondary | ICD-10-CM | POA: Insufficient documentation

## 2024-04-22 NOTE — Progress Notes (Signed)
 GYNECOLOGY ANNUAL PREVENTATIVE CARE ENCOUNTER NOTE  History:    Rachel Swanson is a 48 y.o. [id number] female here for a routine annual gynecologic exam.  Current complaints: none. Desires annual pap and STI screen.   Denies abnormal vaginal bleeding, discharge, pelvic pain, problems with intercourse or other gynecologic concerns.  Gynecologic History Patient's last menstrual period was 03/27/2024. Contraception: none Last Pap: 03/15/2023. Result was normal with negative HPV Last Mammogram: 10/08/2023.  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 100 g 2   escitalopram  (LEXAPRO ) 10 MG tablet Take 1 tablet (10 mg total) by mouth daily. for anxiety and depression. 30 tablet 5   fluticasone  (FLONASE ) 50 MCG/ACT nasal spray PLACE 1 SPRAY INTO BOTH NOSTRILS 2 (TWO) TIMES  DAILY. FOR ALLERGIES. 48 mL 0   hydrochlorothiazide  (HYDRODIURIL ) 12.5 MG tablet TAKE 1 TABLET BY MOUTH EVERY DAY FOR BLOOD PRESSURE 30 tablet 5   levocetirizine (XYZAL ) 5 MG tablet Take 1 tablet (5 mg total) by mouth every evening. For cough/allergies 30 tablet 1   montelukast  (SINGULAIR ) 10 MG tablet Take 1 tablet (10 mg total) by mouth at bedtime. For allergies. 30 tablet 5   olmesartan  (BENICAR ) 20 MG tablet Take 1 tablet (20 mg total) by mouth daily. for blood pressure. 30 tablet 5   tetrahydrozoline 0.05 % ophthalmic solution Place 1 drop into both eyes daily as needed (dry/irritated eyes). (Patient not taking: Reported on 02/05/2024)     No current facility-administered medications on file prior to visit.    Allergies  Allergen Reactions   Ace Inhibitors Cough    Social History:  reports that she has never smoked. She has never used smokeless tobacco. She reports current alcohol use. She reports that she does not use drugs.  Family History  Problem Relation Age of Onset   Heart disease Mother    Hypertension Mother    Hyperlipidemia Father    Heart attack Father    Diabetes Sister    Breast cancer Paternal Aunt    Breast cancer Cousin 28   Colon cancer Neg Hx    Esophageal cancer Neg Hx    Rectal cancer Neg  Hx    Stomach cancer Neg Hx     The following portions of the patient's history were reviewed and updated as appropriate: allergies, current medications, past family history, past medical history, past social history, past surgical history and problem list.  Review of Systems Pertinent items noted in HPI and remainder of comprehensive ROS otherwise negative.  Physical Exam:  BP 103/69   Pulse 65   Ht 5' 4 (1.626 m)   Wt 206 lb (93.4 kg)   LMP 03/27/2024   BMI 35.36 kg/m  CONSTITUTIONAL: Well-developed, well-nourished female in no acute distress.  HENT:  Normocephalic, atraumatic, External right and left ear normal.  EYES: Conjunctivae and EOM are normal.  Pupils are equal, round, and reactive to light. No scleral icterus.  NECK: Normal range of motion, supple, no masses observed. SKIN: Skin is warm and dry. No rash noted. Not diaphoretic. No erythema. No pallor. MUSCULOSKELETAL: Normal range of motion. No tenderness.  No cyanosis, clubbing, or edema. NEUROLOGIC: Alert and oriented to person, place, and time. Normal muscle tone coordination.  PSYCHIATRIC: Normal mood and affect. Normal behavior. Normal judgment and thought content. CARDIOVASCULAR: Normal heart rate noted, regular rhythm RESPIRATORY: Clear to auscultation bilaterally. Effort and breath sounds normal, no problems with respiration noted. BREASTS: Symmetric in size. No masses, tenderness, skin changes, nipple drainage, or lymphadenopathy bilaterally. Performed in the presence of a chaperone. ABDOMEN: Soft, no distention noted.  No tenderness, rebound or guarding.  PELVIC: Normal appearing external genitalia and urethral meatus; normal appearing vaginal mucosa and cervix.  No abnormal vaginal discharge noted.  Pap smear obtained.  Normal uterine size, no other palpable masses, no uterine or adnexal tenderness.  Performed in the presence of a chaperone.  Assessment and Plan:     1. Routine screening for STI (sexually transmitted infection) STI screen done, will follow up results and manage accordingly. - RPR+HBsAg+HCVAb+HIV - Cytology - PAP ancillary testing  2. Well woman exam with routine gynecological exam (Primary) - Cytology - PAP Will follow up results of pap smear and manage accordingly. Mammogram and colon cancer screening are up to date. Routine preventative health maintenance measures emphasized. Please refer to After Visit Summary for other counseling recommendations.      GLORIS HUGGER, MD, FACOG Obstetrician & Gynecologist, [facility] for [employer], [facility]

## 2024-04-23 ENCOUNTER — Ambulatory Visit: Payer: Self-pay | Admitting: Obstetrics & Gynecology

## 2024-04-23 LAB — RPR+HBSAG+HCVAB+...
HIV Screen 4th Generation wRfx: NONREACTIVE
Hep C Virus Ab: NONREACTIVE
Hepatitis B Surface Ag: NEGATIVE
RPR Ser Ql: NONREACTIVE

## 2024-04-27 ENCOUNTER — Other Ambulatory Visit: Payer: Self-pay | Admitting: Primary Care

## 2024-04-27 DIAGNOSIS — J302 Other seasonal allergic rhinitis: Secondary | ICD-10-CM

## 2024-04-29 LAB — CYTOLOGY - PAP
Chlamydia: NEGATIVE
Comment: NEGATIVE
Comment: NEGATIVE
Comment: NEGATIVE
Comment: NORMAL
Diagnosis: NEGATIVE
High risk HPV: NEGATIVE
Neisseria Gonorrhea: NEGATIVE
Trichomonas: NEGATIVE

## 2024-05-01 ENCOUNTER — Other Ambulatory Visit: Payer: Self-pay | Admitting: Primary Care

## 2024-05-01 DIAGNOSIS — J3089 Other allergic rhinitis: Secondary | ICD-10-CM

## 2024-06-08 ENCOUNTER — Other Ambulatory Visit: Payer: Self-pay | Admitting: Primary Care

## 2024-06-08 DIAGNOSIS — J3089 Other allergic rhinitis: Secondary | ICD-10-CM

## 2024-08-25 ENCOUNTER — Other Ambulatory Visit: Payer: Self-pay | Admitting: Obstetrics & Gynecology

## 2024-08-25 DIAGNOSIS — Z1231 Encounter for screening mammogram for malignant neoplasm of breast: Secondary | ICD-10-CM

## 2024-10-08 ENCOUNTER — Ambulatory Visit
Admission: RE | Admit: 2024-10-08 | Discharge: 2024-10-08 | Disposition: A | Discharge status: Home / Self Care | Source: Ambulatory Visit | Attending: Obstetrics & Gynecology | Admitting: Obstetrics & Gynecology

## 2024-10-08 DIAGNOSIS — Z1231 Encounter for screening mammogram for malignant neoplasm of breast: Secondary | ICD-10-CM

## 2024-10-09 ENCOUNTER — Ambulatory Visit

## 2024-10-10 ENCOUNTER — Ambulatory Visit: Payer: Self-pay | Admitting: Primary Care

## 2024-10-30 ENCOUNTER — Other Ambulatory Visit: Payer: Self-pay | Admitting: Primary Care

## 2024-10-30 DIAGNOSIS — I10 Essential (primary) hypertension: Secondary | ICD-10-CM

## 2024-10-30 NOTE — Telephone Encounter (Signed)
Patient is due for CPE/follow up in mid to late May, this will be required prior to any further refills.  Please schedule, thank you!   

## 2024-10-30 NOTE — Telephone Encounter (Signed)
 lvm for pt to call office to schedule appt.
